# Patient Record
Sex: Female | Born: 1970 | Race: Black or African American | Hispanic: No | Marital: Married | Smoking: Former smoker
Health system: Southern US, Community
[De-identification: ages and names within clinical notes are randomized; demographics above are authoritative.]

## PROBLEM LIST (undated history)

## (undated) DIAGNOSIS — E785 Hyperlipidemia, unspecified: Secondary | ICD-10-CM

## (undated) DIAGNOSIS — Z8619 Personal history of other infectious and parasitic diseases: Secondary | ICD-10-CM

## (undated) DIAGNOSIS — F4310 Post-traumatic stress disorder, unspecified: Secondary | ICD-10-CM

## (undated) DIAGNOSIS — K219 Gastro-esophageal reflux disease without esophagitis: Secondary | ICD-10-CM

## (undated) DIAGNOSIS — Z8042 Family history of malignant neoplasm of prostate: Secondary | ICD-10-CM

## (undated) DIAGNOSIS — B019 Varicella without complication: Secondary | ICD-10-CM

## (undated) DIAGNOSIS — E669 Obesity, unspecified: Secondary | ICD-10-CM

## (undated) HISTORY — DX: Gastro-esophageal reflux disease without esophagitis: K21.9

## (undated) HISTORY — DX: Personal history of other infectious and parasitic diseases: Z86.19

## (undated) HISTORY — PX: KNEE SURGERY: SHX244

## (undated) HISTORY — PX: PARTIAL HYSTERECTOMY: SHX80

## (undated) HISTORY — DX: Family history of malignant neoplasm of prostate: Z80.42

## (undated) HISTORY — PX: ABDOMINAL HYSTERECTOMY: SHX81

## (undated) HISTORY — DX: Post-traumatic stress disorder, unspecified: F43.10

## (undated) HISTORY — DX: Hyperlipidemia, unspecified: E78.5

## (undated) HISTORY — PX: CERVIX LESION DESTRUCTION: SHX591

## (undated) HISTORY — DX: Obesity, unspecified: E66.9

## (undated) HISTORY — DX: Varicella without complication: B01.9

## (undated) HISTORY — PX: GYNECOLOGIC CRYOSURGERY: SHX857

---

## 2013-04-21 ENCOUNTER — Emergency Department: Payer: Self-pay | Admitting: Emergency Medicine

## 2013-04-21 LAB — COMPREHENSIVE METABOLIC PANEL
Albumin: 3.8 g/dL (ref 3.4–5.0)
Alkaline Phosphatase: 65 U/L (ref 50–136)
Bilirubin,Total: 0.3 mg/dL (ref 0.2–1.0)
Chloride: 106 mmol/L (ref 98–107)
Co2: 26 mmol/L (ref 21–32)
EGFR (African American): >60
EGFR (Non-African Amer.): >60
Glucose: 81 mg/dL (ref 65–99)
Osmolality: 276 (ref 275–301)
Potassium: 4.1 mmol/L (ref 3.5–5.1)
SGOT(AST): 24 U/L (ref 15–37)
SGPT (ALT): 22 U/L (ref 12–78)
Sodium: 139 mmol/L (ref 136–145)
Total Protein: 7.3 g/dL (ref 6.4–8.2)

## 2013-04-21 LAB — CBC
HCT: 38.4 % (ref 35.0–47.0)
MCH: 31.4 pg (ref 26.0–34.0)
MCHC: 34.4 g/dL (ref 32.0–36.0)
MCV: 91 fL (ref 80–100)
Platelet: 291 10*3/uL (ref 150–440)
RDW: 13.9 % (ref 11.5–14.5)
WBC: 8.2 10*3/uL (ref 3.6–11.0)

## 2013-04-21 LAB — URINALYSIS, COMPLETE
Bacteria: NONE SEEN
Ketone: NEGATIVE
Leukocyte Esterase: NEGATIVE
Ph: 6 (ref 4.5–8.0)
Protein: NEGATIVE
RBC,UR: NONE SEEN /HPF (ref 0–5)

## 2015-07-31 ENCOUNTER — Encounter: Payer: Self-pay | Admitting: Gastroenterology

## 2015-08-05 ENCOUNTER — Ambulatory Visit: Payer: Self-pay | Admitting: Gastroenterology

## 2015-09-25 ENCOUNTER — Encounter: Payer: Self-pay | Admitting: Gastroenterology

## 2015-09-25 ENCOUNTER — Ambulatory Visit (INDEPENDENT_AMBULATORY_CARE_PROVIDER_SITE_OTHER): Payer: Managed Care, Other (non HMO) | Admitting: Gastroenterology

## 2015-09-25 VITALS — BP 124/70 | HR 80 | Ht 68.0 in | Wt 234.0 lb

## 2015-09-25 DIAGNOSIS — R1011 Right upper quadrant pain: Secondary | ICD-10-CM

## 2015-09-25 NOTE — Patient Instructions (Addendum)
We will get records about your recent US report and labs from your PCP.  Further recommendations pending review of those records. Your pains are likely musculoskeletal. Stay on fiber daily.

## 2015-09-25 NOTE — Progress Notes (Signed)
HPI: This is a   very pleasant 44 year old woman  who was referred to me by Dr. Laverle Hobby to evaluate  right upper quadrant abdominal pain, abnormal liver on imaging .    Chief complaint is intermittent right upper quadrant pain. Large liver.  Outside CT scan report 07/2015: "slight enlargement of liver, stable" when compared to CT scan 02/2007   US done as well (no results) Transvaginal US done (no result).  Has been having very brief, seconds long sharp pain the at the right costophrenic margin  The pain is intermittent, no associated with eating. Certain positions can cause it.  The pain is brief (a few seconds).  More noticiable in AM.  Very quick, brief pain.  The pain is a grabbing sensation.  No associated nausea.    Overall stable weight.  Has been excersing more.  Has a problem with constipation  A lot of stress.  Having a lot of pyrosis.  Burning sensation.  This was 4-5 months ago.  Takes h2 blocker as needed.  Review of systems: Pertinent positive and negative review of systems were noted in the above HPI section. Complete review of systems was performed and was otherwise normal.   Past Medical History  Diagnosis Date  . Obesity   BMI 36 (09/2015)  Past Surgical History  Procedure Laterality Date  . Knee surgery Right   . Partial hysterectomy      Current Outpatient Prescriptions  Medication Sig Dispense Refill  . ranitidine (ZANTAC) 300 MG tablet Take 300 mg by mouth at bedtime.     No current facility-administered medications for this visit.    Allergies as of 09/25/2015  . (No Known Allergies)    Family History  Problem Relation Age of Onset  . Prostate cancer Paternal Grandfather   . Diabetes Maternal Grandmother   . Hypertension Maternal Grandmother     Social History   Social History  . Marital Status: Married    Spouse Name: N/A  . Number of Children: 4  . Years of Education: N/A   Occupational History  . nurse    Social History Main  Topics  . Smoking status: Former Smoker -- 0.25 packs/day for 2 years    Types: Cigarettes    Quit date: 08/13/2015  . Smokeless tobacco: Not on file  . Alcohol Use: No  . Drug Use: No  . Sexual Activity: Not on file   Other Topics Concern  . Not on file   Social History Narrative  . No narrative on file     Physical Exam: Ht  (1.727 m)  Wt 234 lb (106.142 kg)  BMI 35.59 kg/m2 Constitutional: generally well-appearing Psychiatric: alert and oriented x3 Eyes: extraocular movements intact Mouth: oral pharynx moist, no lesions Neck: supple no lymphadenopathy Cardiovascular: heart regular rate and rhythm Lungs: clear to auscultation bilaterally Abdomen: soft, nontender, nondistended, no obvious ascites, no peritoneal signs, normal bowel sounds Extremities: no lower extremity edema bilaterally Skin: no lesions on visible extremities   Assessment and plan: 44 y.o. female with  intermittent, brief, right sided upper abdomen versus low chest pain at the right costophrenic margin  Pain is positional. It does not seem GI related. It is not related to eating or moving her bowels. CT scan outside is normal except for slightly large liver. Same mild hepatomegaly was noted 7 or 8 years ago by CAT scan as well. She tells me ultrasound was done of her gallbladder was normal however don't have those records here.  We will get those sent here for review as well as her previous lab tests. I recommended she simply observe her symptoms and if they significantly worsen she should call but these really don't seem GI to me.   Rob Buntinganiel Kyrstal Monterrosa, MD [facility] 09/25/2015, 2:15 PM  Cc: No ref. provider found

## 2015-10-15 ENCOUNTER — Telehealth: Payer: Self-pay | Admitting: Gastroenterology

## 2015-10-15 NOTE — Telephone Encounter (Signed)
Reviewed packet of info from [place]. The was a transvaginal US but no abdominal ultrasound.  I think she still needs abdominal ultrasound for RUQ pains to rule out gallstone disease.  Thanks

## 2015-10-15 NOTE — Telephone Encounter (Signed)
Left message on machine to call back  

## 2015-11-06 NOTE — Telephone Encounter (Signed)
Unable to reach pt by phone letter mailed.  

## 2015-11-06 NOTE — Telephone Encounter (Signed)
Left message on machine to call back  

## 2016-01-28 ENCOUNTER — Telehealth: Payer: Self-pay | Admitting: Gastroenterology

## 2016-01-28 NOTE — Telephone Encounter (Signed)
Pt has BRB on toilet tissue with bowel movement.  She has a history of hemorrhoids.  Pt has constipation on a daily basis unless she eats prunes.  No pain, no fever, some gas and bloating. The pt will try Prep H and start prunes everyday, that seems to keep her bowels soft, and she will call back on Friday if the bleeding continues.  Dr Christella HartiganJacobs the pt was recommended to have US 11/16 to rule out gallbladder disease but I was unable to reach the pt.  Do you want her to have that now or see her in the office first?

## 2016-01-28 NOTE — Telephone Encounter (Signed)
i still recommend the US. Thanks

## 2016-01-28 NOTE — Telephone Encounter (Signed)
Left message on machine to call back  

## 2016-02-03 NOTE — Telephone Encounter (Signed)
Left message on machine to call back letter mailed. 

## 2017-12-13 DIAGNOSIS — Z87891 Personal history of nicotine dependence: Secondary | ICD-10-CM | POA: Insufficient documentation

## 2017-12-13 DIAGNOSIS — Z79899 Other long term (current) drug therapy: Secondary | ICD-10-CM | POA: Diagnosis not present

## 2017-12-13 DIAGNOSIS — R42 Dizziness and giddiness: Secondary | ICD-10-CM | POA: Diagnosis present

## 2017-12-13 DIAGNOSIS — R110 Nausea: Secondary | ICD-10-CM | POA: Insufficient documentation

## 2017-12-13 DIAGNOSIS — H8110 Benign paroxysmal vertigo, unspecified ear: Secondary | ICD-10-CM | POA: Diagnosis not present

## 2017-12-14 ENCOUNTER — Other Ambulatory Visit: Payer: Self-pay

## 2017-12-14 ENCOUNTER — Encounter (HOSPITAL_COMMUNITY): Payer: Self-pay | Admitting: Emergency Medicine

## 2017-12-14 ENCOUNTER — Emergency Department (HOSPITAL_COMMUNITY): Payer: BLUE CROSS/BLUE SHIELD

## 2017-12-14 ENCOUNTER — Emergency Department (HOSPITAL_COMMUNITY)
Admission: EM | Admit: 2017-12-14 | Discharge: 2017-12-14 | Disposition: A | Discharge status: Home / Self Care | Payer: BLUE CROSS/BLUE SHIELD | Attending: Emergency Medicine | Admitting: Emergency Medicine

## 2017-12-14 DIAGNOSIS — R42 Dizziness and giddiness: Secondary | ICD-10-CM

## 2017-12-14 DIAGNOSIS — H8110 Benign paroxysmal vertigo, unspecified ear: Secondary | ICD-10-CM

## 2017-12-14 LAB — I-STAT CHEM 8, ED
BUN: 13 mg/dL (ref 6–20)
CALCIUM ION: 1.19 mmol/L (ref 1.15–1.40)
CHLORIDE: 102 mmol/L (ref 101–111)
Creatinine, Ser: 0.8 mg/dL (ref 0.44–1.00)
Glucose, Bld: 128 mg/dL — ABNORMAL HIGH (ref 65–99)
HCT: 43 % (ref 36.0–46.0)
Hemoglobin: 14.6 g/dL (ref 12.0–15.0)
Potassium: 3.8 mmol/L (ref 3.5–5.1)
SODIUM: 139 mmol/L (ref 135–145)
TCO2: 26 mmol/L (ref 22–32)

## 2017-12-14 LAB — CBC
HEMATOCRIT: 39.8 % (ref 36.0–46.0)
Hemoglobin: 13.5 g/dL (ref 12.0–15.0)
MCH: 30.7 pg (ref 26.0–34.0)
MCHC: 33.9 g/dL (ref 30.0–36.0)
MCV: 90.5 fL (ref 78.0–100.0)
PLATELETS: 382 10*3/uL (ref 150–400)
RBC: 4.4 MIL/uL (ref 3.87–5.11)
RDW: 13.1 % (ref 11.5–15.5)
WBC: 13.5 10*3/uL — ABNORMAL HIGH (ref 4.0–10.5)

## 2017-12-14 MED ORDER — LORAZEPAM 2 MG/ML IJ SOLN
1.0000 mg | Freq: Once | INTRAMUSCULAR | Status: AC
  Administered 2017-12-14: 1 mg via INTRAVENOUS
  Filled 2017-12-14: qty 1

## 2017-12-14 MED ORDER — ONDANSETRON HCL 4 MG/2ML IJ SOLN
4.0000 mg | Freq: Once | INTRAMUSCULAR | Status: AC
  Administered 2017-12-14: 4 mg via INTRAVENOUS
  Filled 2017-12-14: qty 2

## 2017-12-14 MED ORDER — IOPAMIDOL (ISOVUE-370) INJECTION 76%
100.0000 mL | Freq: Once | INTRAVENOUS | Status: AC | PRN
  Administered 2017-12-14: 100 mL via INTRAVENOUS

## 2017-12-14 MED ORDER — MECLIZINE HCL 25 MG PO TABS
50.0000 mg | ORAL_TABLET | Freq: Once | ORAL | Status: AC
  Administered 2017-12-14: 50 mg via ORAL
  Filled 2017-12-14: qty 2

## 2017-12-14 MED ORDER — IOPAMIDOL (ISOVUE-370) INJECTION 76%
INTRAVENOUS | Status: AC
  Administered 2017-12-14: 100 mL via INTRAVENOUS
  Filled 2017-12-14: qty 100

## 2017-12-14 MED ORDER — MECLIZINE HCL 25 MG PO TABS: 25.0000 mg | ORAL_TABLET | Freq: Three times a day (TID) | ORAL | 0 refills | Status: DC | PRN

## 2017-12-14 NOTE — ED Provider Notes (Addendum)
[facility]-EMERGENCY DEPT Provider Note   CSN: [id number] Arrival date & time: 12/13/17  2328     History   Chief Complaint Chief Complaint  Patient presents with  . Dizziness  . Nausea    HPI Stephanie Dunlap is a 47 y.o. female.  HPI  47 year old female comes in with chief complaint of dizziness and nausea.  Patient reports that she started having dizziness around 9 PM.  Patient decided to go to sleep, but she is a on-call nurse, and when she woke up at 1030 she had dizziness again.  The dizziness since then has been constant.  Dizziness is described as unsteadiness/lightheadedness, and there is no associated spinning sensation.  Patient is also having constant nausea without emesis.  Review of system is negative for any focal numbness, tingling, weakness, vision changes, headache, neck pain.  Patient has no history of similar symptoms in the past.  Patient has no known medical problems, she does not smoke or use drugs and there is no family history of premature heart attacks or strokes.  No family history of brain aneurysms and patient denies any neck pain.   Past Medical History:  Diagnosis Date  . Obesity     There are no active problems to display for this patient.   Past Surgical History:  Procedure Laterality Date  . KNEE SURGERY Right   . PARTIAL HYSTERECTOMY      OB History    No data available       Home Medications    Prior to Admission medications   Medication Sig Start Date End Date Taking? Authorizing Provider  meclizine (ANTIVERT) 25 MG tablet Take 1 tablet (25 mg total) by mouth 3 (three) times daily as needed for dizziness. 12/14/17   Derwood Kaplan, MD    Family History Family History  Problem Relation Age of Onset  . Prostate cancer Paternal Grandfather   . Diabetes Maternal Grandmother   . Hypertension Maternal Grandmother     Social History Social History   Tobacco Use  . Smoking status: Former Smoker   Packs/day: 0.25    Years: 2.00    Pack years: 0.50    Types: Cigarettes    Last attempt to quit: 08/13/2015    Years since quitting: 2.3  . Smokeless tobacco: Never Used  Substance Use Topics  . Alcohol use: Yes    Alcohol/week: 0.0 oz    Comment: occ glass of wine  . Drug use: No     Allergies   Patient has no known allergies.   Review of Systems Review of Systems  Constitutional: Positive for activity change.  Cardiovascular: Negative for chest pain.  Gastrointestinal: Negative for abdominal pain.  Neurological: Positive for dizziness. Negative for headaches.  All other systems reviewed and are negative.    Physical Exam Updated Vital Signs BP 125/81 (BP Location: Right Arm)   Pulse 84   Temp 98.3 F (36.8 C) (Oral)   Resp 18   Ht 5\' 8"  (1.727 m)   Wt 110.7 kg (244 lb)   SpO2 97%   BMI 37.10 kg/m   Physical Exam  Constitutional: She is oriented to person, place, and time. She appears well-developed.  HENT:  Head: Normocephalic and atraumatic.  Eyes: EOM are normal. Pupils are equal, round, and reactive to light.  No nystagmus  Neck: Normal range of motion. Neck supple.  Negative hints exam.  Patient does not have meningismus  Cardiovascular: Normal rate.  Pulmonary/Chest: Effort normal.  Abdominal:  Bowel sounds are normal.  Neurological: She is alert and oriented to person, place, and time. No cranial nerve deficit. Coordination normal.  Cerebellar exam is normal (finger to nose) Sensory exam normal for bilateral upper and lower extremities - and patient is able to discriminate between sharp and dull. Motor exam is 4+/5  Patient ambulated to the bathroom for me, she felt a little unsteady throughout her ambulation.  Skin: Skin is warm and dry.  Nursing note and vitals reviewed.    ED Treatments / Results  Labs (all labs ordered are listed, but only abnormal results are displayed) Labs Reviewed  CBC - Abnormal; Notable for the following components:        Result Value   WBC 13.5 (*)    All other components within normal limits  I-STAT CHEM 8, ED - Abnormal; Notable for the following components:   Glucose, Bld 128 (*)    All other components within normal limits    EKG  EKG Interpretation None       Radiology Ct Angio Head W/cm &/or Wo Cm  Result Date: 12/14/2017 CLINICAL DATA:  Dizziness EXAM: CT ANGIOGRAPHY HEAD AND NECK TECHNIQUE: Multidetector CT imaging of the head and neck was performed using the standard protocol during bolus administration of intravenous contrast. Multiplanar CT image reconstructions and MIPs were obtained to evaluate the vascular anatomy. Carotid stenosis measurements (when applicable) are obtained utilizing NASCET criteria, using the distal internal carotid diameter as the denominator. CONTRAST:  100 mL Isovue 370 COMPARISON:  None. FINDINGS: CT HEAD FINDINGS Brain: No mass lesion, intraparenchymal hemorrhage or extra-axial collection. No evidence of acute cortical infarct. Brain parenchyma and CSF-containing spaces are normal for age. Vascular: No hyperdense vessel or unexpected calcification. Skull: Normal visualized skull base, calvarium and extracranial soft tissues. Sinuses/Orbits: No sinus fluid levels or advanced mucosal thickening. No mastoid effusion. Normal orbits. CTA NECK FINDINGS Aortic arch: There is no calcific atherosclerosis of the aortic arch. There is no aneurysm, dissection or hemodynamically significant stenosis of the visualized ascending aorta and aortic arch. Conventional 3 vessel aortic branching pattern. The visualized proximal subclavian arteries are normal. Right carotid system: The right common carotid origin is widely patent. There is no common carotid or internal carotid artery dissection or aneurysm. No hemodynamically significant stenosis. Left carotid system: The left common carotid origin is widely patent. There is no common carotid or internal carotid artery dissection or aneurysm.  No hemodynamically significant stenosis. Vertebral arteries: The vertebral system is codominant. Both vertebral artery origins are normal. Both vertebral arteries are normal to their confluence with the basilar artery. Skeleton: There is no bony spinal canal stenosis. No lytic or blastic lesions. Other neck: The nasopharynx is clear. The oropharynx and hypopharynx are normal. The epiglottis is normal. The supraglottic larynx, glottis and subglottic larynx are normal. No retropharyngeal collection. The parapharyngeal spaces are preserved. The parotid and submandibular glands are normal. No sialolithiasis or salivary ductal dilatation. The thyroid gland is normal. There is no cervical lymphadenopathy. Upper chest: No pneumothorax or pleural effusion. No nodules or masses. Review of the MIP images confirms the above findings CTA HEAD FINDINGS Anterior circulation: --Intracranial internal carotid arteries: Normal. --Anterior cerebral arteries: Normal. --Middle cerebral arteries: Normal. --Posterior communicating arteries: Absent bilaterally. Posterior circulation: --Posterior cerebral arteries: Normal. --Superior cerebellar arteries: Normal. --Basilar artery: Normal. --Anterior inferior cerebellar arteries: Normal. --Posterior inferior cerebellar arteries: Normal. Venous sinuses: As permitted by contrast timing, patent. Anatomic variants: None Delayed phase: No parenchymal contrast enhancement. Review of the  MIP images confirms the above findings. IMPRESSION: 1. No acute intracranial abnormality. 2. No emergent large vessel occlusion or hemodynamically significant stenosis. 3. No carotid or vertebral artery stenosis. Electronically Signed   By: Deatra Robinson M.D.   On: 12/14/2017 03:05   Ct Angio Neck W And/or Wo Contrast  Result Date: 12/14/2017 CLINICAL DATA:  Dizziness EXAM: CT ANGIOGRAPHY HEAD AND NECK TECHNIQUE: Multidetector CT imaging of the head and neck was performed using the standard protocol during bolus  administration of intravenous contrast. Multiplanar CT image reconstructions and MIPs were obtained to evaluate the vascular anatomy. Carotid stenosis measurements (when applicable) are obtained utilizing NASCET criteria, using the distal internal carotid diameter as the denominator. CONTRAST:  100 mL Isovue 370 COMPARISON:  None. FINDINGS: CT HEAD FINDINGS Brain: No mass lesion, intraparenchymal hemorrhage or extra-axial collection. No evidence of acute cortical infarct. Brain parenchyma and CSF-containing spaces are normal for age. Vascular: No hyperdense vessel or unexpected calcification. Skull: Normal visualized skull base, calvarium and extracranial soft tissues. Sinuses/Orbits: No sinus fluid levels or advanced mucosal thickening. No mastoid effusion. Normal orbits. CTA NECK FINDINGS Aortic arch: There is no calcific atherosclerosis of the aortic arch. There is no aneurysm, dissection or hemodynamically significant stenosis of the visualized ascending aorta and aortic arch. Conventional 3 vessel aortic branching pattern. The visualized proximal subclavian arteries are normal. Right carotid system: The right common carotid origin is widely patent. There is no common carotid or internal carotid artery dissection or aneurysm. No hemodynamically significant stenosis. Left carotid system: The left common carotid origin is widely patent. There is no common carotid or internal carotid artery dissection or aneurysm. No hemodynamically significant stenosis. Vertebral arteries: The vertebral system is codominant. Both vertebral artery origins are normal. Both vertebral arteries are normal to their confluence with the basilar artery. Skeleton: There is no bony spinal canal stenosis. No lytic or blastic lesions. Other neck: The nasopharynx is clear. The oropharynx and hypopharynx are normal. The epiglottis is normal. The supraglottic larynx, glottis and subglottic larynx are normal. No retropharyngeal collection. The  parapharyngeal spaces are preserved. The parotid and submandibular glands are normal. No sialolithiasis or salivary ductal dilatation. The thyroid gland is normal. There is no cervical lymphadenopathy. Upper chest: No pneumothorax or pleural effusion. No nodules or masses. Review of the MIP images confirms the above findings CTA HEAD FINDINGS Anterior circulation: --Intracranial internal carotid arteries: Normal. --Anterior cerebral arteries: Normal. --Middle cerebral arteries: Normal. --Posterior communicating arteries: Absent bilaterally. Posterior circulation: --Posterior cerebral arteries: Normal. --Superior cerebellar arteries: Normal. --Basilar artery: Normal. --Anterior inferior cerebellar arteries: Normal. --Posterior inferior cerebellar arteries: Normal. Venous sinuses: As permitted by contrast timing, patent. Anatomic variants: None Delayed phase: No parenchymal contrast enhancement. Review of the MIP images confirms the above findings. IMPRESSION: 1. No acute intracranial abnormality. 2. No emergent large vessel occlusion or hemodynamically significant stenosis. 3. No carotid or vertebral artery stenosis. Electronically Signed   By: Deatra Robinson M.D.   On: 12/14/2017 03:05    Procedures Procedures (including critical care time)  Medications Ordered in ED Medications  LORazepam (ATIVAN) injection 1 mg (1 mg Intravenous Given 12/14/17 0145)  ondansetron (ZOFRAN) injection 4 mg (4 mg Intravenous Given 12/14/17 0145)  iopamidol (ISOVUE-370) 76 % injection 100 mL (100 mLs Intravenous Contrast Given 12/14/17 0223)  meclizine (ANTIVERT) tablet 50 mg (50 mg Oral Given 12/14/17 0317)     Initial Impression / Assessment and Plan / ED Course  I have reviewed the triage vital signs and the nursing  notes.  Pertinent labs & imaging results that were available during my care of the patient were reviewed by me and considered in my medical decision making (see chart for details).  Clinical Course as of  Dec 14 1208  Tue Dec 14, 2017  0526 Labs and imaging results are normal.  Patient feels a lot better after she got IV Ativan and oral meclizine.  Patient now does appreciate worsening of her symptoms only when she moves her head.  We will treat patient has BPPV.  Since patient has had some pressure in her right ear, we will give her ENT follow-up as needed. CT Angio Head W/Cm &/Or Wo Cm [AN]    Clinical Course User Index [AN] Derwood KaplanNanavati, Lott Seelbach, MD    Patient comes in with chief complaint of constant dizziness.  Patient reports that her symptoms initially were intermittent, but now are constant.  Last normal around 9 PM.  Patient has no history of any kind of medical conditions, and has no stroke risk factors.  However her symptoms are not classic of peripheral vertigo, and that there are not necessarily worse with head movement, not severe, not intermittent.  Given the constant vertigo which is not severe and there is associated nausea with it we will get a CT angiogram head and neck.  If the CT angiogram is normal we will treat patient as peripheral vertigo.   Final Clinical Impressions(s) / ED Diagnoses   Final diagnoses:  Vertigo  Benign paroxysmal positional vertigo, unspecified laterality    ED Discharge Orders        Ordered    meclizine (ANTIVERT) 25 MG tablet  3 times daily PRN     12/14/17 0527       Derwood KaplanNanavati, Tavius Turgeon, MD 12/14/17 0330    Derwood KaplanNanavati, Daymeon Fischman, MD 12/14/17 1210

## 2017-12-14 NOTE — ED Triage Notes (Signed)
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

## 2017-12-14 NOTE — Discharge Instructions (Signed)
He was seen in the ER for the dizziness, which appears to be secondary to BPPV. Take the medicines prescribed and perform the exercises requested 6-10 times a day.  See the ENT doctors if your symptoms do not get better.

## 2018-10-14 ENCOUNTER — Other Ambulatory Visit: Payer: Self-pay | Admitting: Obstetrics & Gynecology

## 2018-10-14 DIAGNOSIS — Z1231 Encounter for screening mammogram for malignant neoplasm of breast: Secondary | ICD-10-CM

## 2018-11-25 ENCOUNTER — Ambulatory Visit
Admission: RE | Admit: 2018-11-25 | Discharge: 2018-11-25 | Disposition: A | Discharge status: Home / Self Care | Payer: BLUE CROSS/BLUE SHIELD | Source: Ambulatory Visit | Attending: Obstetrics & Gynecology | Admitting: Obstetrics & Gynecology

## 2018-11-25 DIAGNOSIS — Z1231 Encounter for screening mammogram for malignant neoplasm of breast: Secondary | ICD-10-CM

## 2019-05-01 ENCOUNTER — Encounter: Payer: Self-pay | Admitting: Gastroenterology

## 2019-05-01 ENCOUNTER — Other Ambulatory Visit: Payer: Self-pay

## 2019-05-01 ENCOUNTER — Ambulatory Visit (INDEPENDENT_AMBULATORY_CARE_PROVIDER_SITE_OTHER): Payer: Managed Care, Other (non HMO) | Admitting: Gastroenterology

## 2019-05-01 VITALS — Ht 68.0 in | Wt 226.0 lb

## 2019-05-01 DIAGNOSIS — K649 Unspecified hemorrhoids: Secondary | ICD-10-CM

## 2019-05-01 MED ORDER — PEG 3350-KCL-NA BICARB-NACL 420 G PO SOLR: 4000.0000 mL | ORAL | 0 refills | Status: DC

## 2019-05-01 NOTE — Patient Instructions (Addendum)
She will start taking Citrucel fiber supplement once daily instead of pill stool softener for now.  She will complete the oral steroids that she has been taking.  She will stop steroid suppositories since most of the hemorrhoidal swelling is gone now.  We will arrange a colonoscopy at her soonest convenience for colon cancer screening, she is African-American and 48 years old.  I would like the colonoscopy to be in mid July to give her some more time to recover from her hemorrhoids as well as coronavirus infection.  Thank you for entrusting me with your care and choosing [facility].  Dr Ardis Hughs

## 2019-05-01 NOTE — Progress Notes (Signed)
This service was provided via virtual visit.  We attempted audio and visual connection however visual was not possible and so we went with audio only.  The patient was located at home.  I was located in my office.  The patient did consent to this virtual visit and is aware of possible charges through their insurance for this visit.  The patient is a new patient (it has been 3 and half years since she has been here, she is here for a new problem).  my certified medical assistant, Barbaraann RondoKelly Tadros, contributed to this visit by contacting the patient by phone 1 or 2 business days prior to the appointment and also followed up on the recommendations I made after the visit.  Time spent on virtual visit: 23 minutes   HPI: This is a very pleasant 48 year old woman whom I last saw  November 2016 for intermittent brief right-sided upper abdominal pain.  It did not seem related to her GI tract.  Outside CT scan of her abdomen was normal except for slightly large liver.  The same mild hepatomegaly have been noted 7 to 8 years ago by CT scan.  She had an ultrasound elsewhere and told me that it was normal.  It turns out that was a transvaginal ultrasound.  I ordered a right upper quadrant ultrasound however she never had done.  She had Covid infection. She had some SOB, coughing, fevers.  She was diagnosed mid May, fully recovered about a week ago.  She had diarrhea 2-3 times.  She was coughing a lot. a lot of pain, swollen hemorrhoids, very raw and swollen.  She started steroid topical cream and suppository.  The hemorrhoids bled once. This helped the hemorrhoids really shrink.  Was put on oral steroids.  Should be done in 6-7 days.  Most of the hemorrhoidal swelling is gone now.  She said it is much much better than it was a week or 10 days ago.   Chief complaint is swollen, probably previously thrombosed external hemorrhoids  ROS: complete GI ROS as described in HPI, all other review  negative.  Constitutional:  No unintentional weight loss   Past Medical History:  Diagnosis Date  . Obesity     Past Surgical History:  Procedure Laterality Date  . KNEE SURGERY Right   . PARTIAL HYSTERECTOMY      Current Outpatient Medications  Medication Sig Dispense Refill  . hydrocortisone (ANUSOL-HC) 25 MG suppository Place 25 mg rectally daily.    . hydrocortisone 2.5 % cream Apply topically as needed.    . predniSONE (STERAPRED UNI-PAK 21 TAB) 10 MG (21) TBPK tablet Take by mouth daily.     No current facility-administered medications for this visit.     Allergies as of 05/01/2019  . (No Known Allergies)    Family History  Problem Relation Age of Onset  . Prostate cancer Paternal Grandfather   . Diabetes Maternal Grandmother   . Hypertension Maternal Grandmother   . Breast cancer Neg Hx     Social History   Socioeconomic History  . Marital status: Married    Spouse name: Not on file  . Number of children: 4  . Years of education: Not on file  . Highest education level: Not on file  Occupational History  . Occupation: nurse  Social Needs  . Financial resource strain: Not on file  . Food insecurity:    Worry: Not on file    Inability: Not on file  .  Transportation needs:    Medical: Not on file    Non-medical: Not on file  Tobacco Use  . Smoking status: Former Smoker    Packs/day: 0.25    Years: 2.00    Pack years: 0.50    Types: Cigarettes    Last attempt to quit: 08/13/2015    Years since quitting: 3.7  . Smokeless tobacco: Never Used  Substance and Sexual Activity  . Alcohol use: Yes    Alcohol/week: 0.0 standard drinks    Comment: occ glass of wine  . Drug use: No  . Sexual activity: Not on file  Lifestyle  . Physical activity:    Days per week: Not on file    Minutes per session: Not on file  . Stress: Not on file  Relationships  . Social connections:    Talks on phone: Not on file    Gets together: Not on file    Attends  religious service: Not on file    Active member of club or organization: Not on file    Attends meetings of clubs or organizations: Not on file    Relationship status: Not on file  . Intimate partner violence:    Fear of current or ex partner: Not on file    Emotionally abused: Not on file    Physically abused: Not on file    Forced sexual activity: Not on file  Other Topics Concern  . Not on file  Social History Narrative  . Not on file     Physical Exam: Unable to perform because this was a "telemed visit" due to current Covid-19 pandemic  Assessment and plan: 48 y.o. female with hemorrhoids  She really describes swollen and probably thrombosed external anal hemorrhoids.  Obviously I was not able to see them given that this is a telemedicine visit.  The vast majority of the swelling and discomfort has completely abated since topical steroids and near completion of oral steroids as well.  I recommended she stop the topical steroids for now.  I also recommended that she start taking fiber supplements on a daily basis.  She will complete her oral steroid course.  She is 48 years old, African-American I recommend colon cancer screening at her soonest convenience with a colonoscopy.  I think it would probably be best to give her another 4 to 6 weeks to recover from her hemorrhoids.  This will also give a bit more time since her coronavirus infection.  She felt completely recovered from that infection as of about a week ago of note.  Please see the "Patient Instructions" section for addition details about the plan.  Owens Loffler, MD [facility] 05/01/2019, 1:41 PM

## 2019-05-31 ENCOUNTER — Encounter: Payer: Self-pay | Admitting: Gastroenterology

## 2019-06-05 ENCOUNTER — Telehealth: Payer: Self-pay | Admitting: Gastroenterology

## 2019-06-05 NOTE — Telephone Encounter (Signed)

## 2019-06-06 ENCOUNTER — Ambulatory Visit (AMBULATORY_SURGERY_CENTER): Payer: Managed Care, Other (non HMO) | Admitting: Gastroenterology

## 2019-06-06 ENCOUNTER — Other Ambulatory Visit: Payer: Self-pay

## 2019-06-06 ENCOUNTER — Encounter: Payer: Self-pay | Admitting: Gastroenterology

## 2019-06-06 VITALS — BP 113/79 | HR 76 | Temp 98.9°F | Resp 14 | Ht 68.0 in | Wt 226.0 lb

## 2019-06-06 DIAGNOSIS — Z1211 Encounter for screening for malignant neoplasm of colon: Secondary | ICD-10-CM

## 2019-06-06 DIAGNOSIS — K649 Unspecified hemorrhoids: Secondary | ICD-10-CM

## 2019-06-06 MED ORDER — SODIUM CHLORIDE 0.9 % IV SOLN: 500.0000 mL | Freq: Once | INTRAVENOUS | Status: DC

## 2019-06-06 NOTE — Op Note (Signed)
[place] Patient Name: Stephanie Dunlap Procedure Date: 06/06/2019 12:48 PM MRN: [id number] Endoscopist: Milus Banister , MD Age: 48 Referring MD:  Date of Birth: 05-12-1971 Gender: Female Account #: [id number] Procedure:                Colonoscopy Indications:              Screening for colorectal malignant neoplasm Medicines:                Monitored Anesthesia Care Procedure:                Pre-Anesthesia Assessment:                           - Prior to the procedure, a History and Physical                            was performed, and patient medications and                            allergies were reviewed. The patient's tolerance of                            previous anesthesia was also reviewed. The risks                            and benefits of the procedure and the sedation                            options and risks were discussed with the patient.                            All questions were answered, and informed consent                            was obtained. Prior Anticoagulants: The patient has                            taken no previous anticoagulant or antiplatelet                            agents. ASA Grade Assessment: II - A patient with                            mild systemic disease. After reviewing the risks                            and benefits, the patient was deemed in                            satisfactory condition to undergo the procedure.                           After obtaining informed consent, the colonoscope  was passed under direct vision. Throughout the                            procedure, the patient's blood pressure, pulse, and                            oxygen saturations were monitored continuously. The                            Colonoscope was introduced through the anus and                            advanced to the the cecum, identified by                            appendiceal orifice and  ileocecal valve. The                            colonoscopy was performed without difficulty. The                            patient tolerated the procedure well. The quality                            of the bowel preparation was good. The ileocecal                            valve, appendiceal orifice, and rectum were                            photographed. Scope In: 1:21:29 PM Scope Out: 1:33:22 PM Scope Withdrawal Time: 0 hours 8 minutes 1 second  Total Procedure Duration: 0 hours 11 minutes 53 seconds  Findings:                 External and internal hemorrhoids were found. The                            hemorrhoids were medium-sized.                           The exam was otherwise without abnormality on                            direct and retroflexion views. Complications:            No immediate complications. Estimated blood loss:                            None. Estimated Blood Loss:     Estimated blood loss: none. Impression:               - Medium sized external and small internal                            hemorrhoids.                           -  The examination was otherwise normal on direct                            and retroflexion views.                           - No specimens collected. Recommendation:           - Patient has a contact number available for                            emergencies. The signs and symptoms of potential                            delayed complications were discussed with the                            patient. Return to normal activities tomorrow.                            Written discharge instructions were provided to the                            patient.                           - Resume previous diet. Continue fiber supplement                            once daily.                           - Continue present medications.                           - Repeat colonoscopy in 10 years for screening. [name], MD 06/06/2019  1:35:32 PM This report has been signed electronically.

## 2019-06-06 NOTE — Progress Notes (Signed)
Report given to PACU, vss 

## 2019-06-06 NOTE — Progress Notes (Signed)
[place] [place]  Pt was diagnosed in May 2020 with the COVID, has had a negative test since then

## 2019-06-06 NOTE — Patient Instructions (Signed)
Thank you for choosing [place] for your healthcare needs today.  REad all handouts given to  You by your recovery room nurse.  YOU HAD AN ENDOSCOPIC PROCEDURE TODAY AT [facility]:   Refer to the procedure report that was given to you for any specific questions about what was found during the examination.  If the procedure report does not answer your questions, please call your gastroenterologist to clarify.  If you requested that your care partner not be given the details of your procedure findings, then the procedure report has been included in a sealed envelope for you to review at your convenience later.  YOU SHOULD EXPECT: Some feelings of bloating in the abdomen. Passage of more gas than usual.  Walking can help get rid of the air that was put into your GI tract during the procedure and reduce the bloating. If you had a lower endoscopy (such as a colonoscopy or flexible sigmoidoscopy) you may notice spotting of blood in your stool or on the toilet paper. If you underwent a bowel prep for your procedure, you may not have a normal bowel movement for a few days.  Please Note:  You might notice some irritation and congestion in your nose or some drainage.  This is from the oxygen used during your procedure.  There is no need for concern and it should clear up in a day or so.  SYMPTOMS TO REPORT IMMEDIATELY:   Following lower endoscopy (colonoscopy or flexible sigmoidoscopy):  Excessive amounts of blood in the stool  Significant tenderness or worsening of abdominal pains  Swelling of the abdomen that is new, acute  Fever of 100F or higher   For urgent or emergent issues, a gastroenterologist can be reached at any hour by calling [phone].   DIET:  We do recommend a small meal at first, but then you may proceed to your regular diet.  Drink plenty of fluids but you should avoid alcoholic beverages for 24 hours. Try to increase the fiber in your diet, and drink plenty of  water.  ACTIVITY:  You should plan to take it easy for the rest of today and you should NOT DRIVE or use heavy machinery until tomorrow (because of the sedation medicines used during the test).    FOLLOW UP: Our staff will call the number listed on your records 48-72 hours following your procedure to check on you and address any questions or concerns that you may have regarding the information given to you following your procedure. If we do not reach you, we will leave a message.  We will attempt to reach you two times.  During this call, we will ask if you have developed any symptoms of COVID 19. If you develop any symptoms (ie: fever, flu-like symptoms, shortness of breath, cough etc.) before then, please call [phone].  If you test positive for Covid 19 in the 2 weeks post procedure, please call and report this information to [place].    If any biopsies were taken you will be contacted by phone or by letter within the next 1-3 weeks.  Please call us at [phone] if you have not heard about the biopsies in 3 weeks.    SIGNATURES/CONFIDENTIALITY: You and/or your care partner have signed paperwork which will be entered into your electronic medical record.  These signatures attest to the fact that that the information above on your After Visit Summary has been reviewed and is understood.  Full responsibility of the confidentiality  of this discharge information lies with you and/or your care-partner.

## 2019-06-08 ENCOUNTER — Telehealth: Payer: Self-pay

## 2019-06-08 NOTE — Telephone Encounter (Signed)
  Follow up Call-  Call back number 06/06/2019  Post procedure Call Back phone  # [phone]  Permission to leave phone message Yes  Some recent data might be hidden     Patient questions:  Do you have a fever, pain , or abdominal swelling? No. Pain Score  0 *  Have you tolerated food without any problems? Yes.    Have you been able to return to your normal activities? Yes.    Do you have any questions about your discharge instructions: Diet   No. Medications  No. Follow up visit  No.  Do you have questions or concerns about your Care? No.  Actions: * If pain score is 4 or above: 1. No action needed, pain <4.Have you developed a fever since your procedure? no  2.   Have you had an respiratory symptoms (SOB or cough) since your procedure? no  3.   Have you tested positive for COVID 19 since your procedure no  4.   Have you had any family members/close contacts diagnosed with the COVID 19 since your procedure?  no   If yes to any of these questions please route to Joylene John, RN and Alphonsa Gin, Therapist, sports.

## 2019-09-04 ENCOUNTER — Encounter: Payer: Self-pay | Admitting: Family Medicine

## 2019-09-04 ENCOUNTER — Ambulatory Visit: Payer: Managed Care, Other (non HMO) | Admitting: Family Medicine

## 2019-09-04 ENCOUNTER — Ambulatory Visit (INDEPENDENT_AMBULATORY_CARE_PROVIDER_SITE_OTHER): Payer: Managed Care, Other (non HMO) | Admitting: Family Medicine

## 2019-09-04 ENCOUNTER — Other Ambulatory Visit: Payer: Self-pay

## 2019-09-04 VITALS — BP 118/94 | HR 76 | Temp 98.0°F | Resp 18 | Ht 68.0 in | Wt 245.4 lb

## 2019-09-04 DIAGNOSIS — F4310 Post-traumatic stress disorder, unspecified: Secondary | ICD-10-CM | POA: Diagnosis not present

## 2019-09-04 DIAGNOSIS — R0600 Dyspnea, unspecified: Secondary | ICD-10-CM

## 2019-09-04 DIAGNOSIS — H6983 Other specified disorders of Eustachian tube, bilateral: Secondary | ICD-10-CM

## 2019-09-04 DIAGNOSIS — H6980 Other specified disorders of Eustachian tube, unspecified ear: Secondary | ICD-10-CM | POA: Insufficient documentation

## 2019-09-04 DIAGNOSIS — K644 Residual hemorrhoidal skin tags: Secondary | ICD-10-CM | POA: Insufficient documentation

## 2019-09-04 DIAGNOSIS — J1289 Other viral pneumonia: Secondary | ICD-10-CM

## 2019-09-04 DIAGNOSIS — L659 Nonscarring hair loss, unspecified: Secondary | ICD-10-CM | POA: Diagnosis not present

## 2019-09-04 DIAGNOSIS — R002 Palpitations: Secondary | ICD-10-CM

## 2019-09-04 DIAGNOSIS — U071 COVID-19: Secondary | ICD-10-CM | POA: Insufficient documentation

## 2019-09-04 DIAGNOSIS — J1282 Pneumonia due to coronavirus disease 2019: Secondary | ICD-10-CM | POA: Insufficient documentation

## 2019-09-04 DIAGNOSIS — K219 Gastro-esophageal reflux disease without esophagitis: Secondary | ICD-10-CM | POA: Insufficient documentation

## 2019-09-04 DIAGNOSIS — Z8619 Personal history of other infectious and parasitic diseases: Secondary | ICD-10-CM | POA: Insufficient documentation

## 2019-09-04 MED ORDER — FAMOTIDINE 40 MG PO TABS: 40.0000 mg | ORAL_TABLET | Freq: Every day | ORAL | 5 refills | Status: DC

## 2019-09-04 MED ORDER — ALPRAZOLAM 0.25 MG PO TABS: 0.2500 mg | ORAL_TABLET | Freq: Two times a day (BID) | ORAL | 0 refills | Status: DC | PRN

## 2019-09-04 NOTE — Assessment & Plan Note (Signed)
With some dyspnea with exertion. Proceed with echo and try alprazolam prn for a bad episode to evaluate anxiety as a cause. Proceed with echo and report if symptoms worsen

## 2019-09-04 NOTE — Assessment & Plan Note (Signed)
She reports a long history of intermittent ringing in her ears which only last a minute or so. +/- pressure in face and ears with a sense of dysequilibrium. Has had a CT angio of head that was unremarkable last year. Started on daily Cetirizine, Fluticasone and nasal saline. Reassess at next visit. Reminded to hydrate well and do not skip meals.

## 2019-09-04 NOTE — Assessment & Plan Note (Signed)
daughter had cardiac arrest from covid in front of her after the daughter was her care provider when she had COVID she is very tearful during visit. She does not want to start a dailiy SSRI yet but is seeing a counselor which she thinks is helpful. Is given a small number of Alprazolam 0.25 mg to use prn for anxiety attacks. She will call if she changes her mind

## 2019-09-04 NOTE — Patient Instructions (Addendum)
Zinc 50 mg, Biotin, fish oil daily   Synchronicity CBD Probiotics such [employer] company, [employer] colon health  Pulse oximeter  Omron Blood pressure cuff, upper arm  Weekly vitals  Mylanta   Flonase, Zyrtec and nasal saline daily call if worsening   Managing Post-Traumatic Stress Disorder If you have been diagnosed with post-traumatic stress disorder (PTSD), you may be relieved that you now know why you have felt or behaved a certain way. Still, you may feel overwhelmed about the treatment ahead. You may also wonder how to get the support you need and how to deal with the condition day-to-day. If you are living with PTSD, there are ways to help you recover from it and manage your symptoms. How to manage lifestyle changes Managing stress Stress is your body's reaction to life changes and events, both good and bad. Stress can make PTSD worse. Take the following steps to manage stress:  Talk with your health care provider or a counselor if you would like to learn more about techniques to reduce your stress. He or she may suggest some stress reduction techniques such as: ? Muscle relaxation exercises. ? Regular exercise. ? Meditation, yoga, or other mind-body exercises. ? Breathing exercises. ? Listening to quiet music. ? Spending time outside.  Maintain a healthy lifestyle. Eat a healthy diet, exercise regularly, get plenty of sleep, and take time to relax.  Spend time with others. Talk with them about how you are feeling and what kind of support you need. Try not to isolate yourself, even though you may feel like doing that. Isolating yourself can delay your recovery.  Do activities and hobbies that you enjoy.  Pace yourself when doing stressful things. Take breaks, and reward yourself when you finish. Make sure that you do not overload your schedule.  Medicines Your health care provider may suggest certain medicines if he or she feels that they will help to improve your condition.  Medicines for depression (antidepressants) or severe loss of contact with reality (antipsychotics) may be used to treat PTSD. Avoid using alcohol and other substances that may prevent your medicines from working properly. It is also important to:  Talk with your pharmacist or health care provider about all medicines that you take, their possible side effects, and which medicines are safe to take together.  Make it your goal to take part in all treatment decisions (shared decision-making). Ask about possible side effects of medicines that your health care provider recommends, and tell him or her how you feel about having those side effects. It is best if shared decision-making with your health care provider is part of your total treatment plan. If your health care provider prescribes a medicine, you may not notice the full benefits of it for 4-8 weeks. Most people who are treated for PTSD need to take medicine for at least 6-12 months after they feel better. If you are taking medicines as part of your treatment, do not stop taking medicines before you ask your health care provider if it is safe to stop. You may need to have the medicine slowly decreased (tapered) over time to lower the risk of harmful side effects. Relationships Many people who have PTSD have difficulty trusting others. Make an effort to:  Take risks and develop trust with close friends and family members. Developing trust in others can help you feel safe and connect you with emotional support.  Be open and honest about your feelings.  Have fun and relax in safe spaces, such  as with friends and family.  Think about going to couples counseling, family education classes, or family therapy. Your loved ones may not always know how to be supportive. Therapy can be helpful for everyone. How to recognize changes in your condition Be aware of your symptoms and how often you have them. The following symptoms mean that you need to seek help  for your PTSD:  You feel suspicious and angry.  You have repeated flashbacks.  You avoid going out or being with others.  You have an increasing number of fights with close friends or family members, such as your spouse.  You have thoughts about hurting yourself or others.  You cannot get relief from feelings of depression or anxiety. Follow these instructions at home: Lifestyle  Exercise regularly. Try to do 30 or more minutes of physical activity on most days of the week.  Try to get 7-9 hours of sleep each night. To help with sleep: ? Keep your bedroom cool and dark. ? Avoid screen time before bedtime. This means avoiding use of your TV, computer, tablet, and cell phone.  Practice self-soothing skills and use them daily.  Try to have fun and seek humor in your life. Eating and drinking  Do not eat a heavy meal during the hour before you go to bed.  Do not drink alcohol or caffeinated drinks before bed.  Avoid using alcohol or drugs. General instructions  If your PTSD is affecting your marriage or family, seek help from a family therapist.  Take over-the-counter and prescription medicines only as told by your health care provider.  Make sure to let all of your health care providers know that you have PTSD. This is especially important if you are having surgery or need to be admitted to the hospital.  Keep all follow-up visits as told by your health care providers. This is important. Where to find support Talking to others  Explain that PTSD is a mental health problem. It is something that a person can develop after experiencing or seeing a life-threatening event. Tell them that PTSD makes you feel stress like you did during the event.  Talk to your loved ones about the symptoms you have. Also tell them what things or situations can cause symptoms to start (are triggers for you).  Assure your loved ones that there are treatments to help PTSD. Discuss possibly seeking  family therapy or couples therapy.  If you are worried or fearful about seeking treatment, ask for support.  Keep daily contact with at least one trusted friend or family member. Finances Not all insurance plans cover mental health care, so it is important to check with your insurance carrier. If paying for co-pays or counseling services is a problem, search for a local or county mental health care center. Public mental health care services may be offered there at a low cost or no cost when you are not able to see a private health care provider. If you are a veteran, contact a local veterans organization or veterans hospital for more information. If you are taking medicine for PTSD, you may be able to get the genericform, which may be less expensive than brand-name medicine. Some makers of prescription medicines also offer help to patients who cannot afford the medicines that they need. Therapy and support groups  Find a support group in your community. Often, groups are available for [employer] veterans, trauma victims, and family members or caregivers.  Look into volunteer opportunities. Taking part in these  can help you feel more connected to your community.  Contact a local organization to find out if you are eligible for a service dog. Where to find more information Go to this website to find more information about PTSD, treatment of PTSD, and how to get support:  [facility]: www.ptsd.[employer].tnva.gov Contact a health care provider if:  Your symptoms get worse or do not get better. Get help right away if:  You have thoughts about hurting yourself or others. If you ever feel like you may hurt yourself or others, or have thoughts about taking your own life, get help right away. You can go to your nearest emergency department or call:  Your local emergency services (911 in the [place]  A suicide crisis helpline, such as the National Suicide Prevention Lifeline at 620-503-89921-970-199-1231. This  is open 24-hours a day. Summary  If you are living with PTSD, there are ways to help you recover from it and manage your symptoms.  Find supportive environments and people who understand PTSD. Spend time in those places, and maintain contact with those people.  Work with your health care team to create a plan for managing PTSD. The plan should include counseling, stress reduction techniques, and healthy lifestyle habits. This information is not intended to replace advice given to you by your health care provider. Make sure you discuss any questions you have with your health care provider. Document Released: 03/11/2017 Document Revised: 03/03/2019 Document Reviewed: 03/11/2017 Elsevier Patient Education  2020 [employer]

## 2019-09-04 NOTE — Assessment & Plan Note (Signed)
Resolved with suppositories and oral steroids and was seen by gastroenterology, Dr Ardis Hughs

## 2019-09-04 NOTE — Assessment & Plan Note (Signed)
Stress and COVID induced. Start Zinc, Biotin and fish oil and check labs.

## 2019-09-04 NOTE — Assessment & Plan Note (Signed)
She describes burning in her chest worse after eating and worse since getting COVID. Started on Famotidine 40 mg po qhs and report if no response or worsens for referral

## 2019-09-04 NOTE — Progress Notes (Signed)
Subjective:    Patient ID: Stephanie Dunlap, female    DOB: 18-Mar-1971, 48 y.o.   MRN: [id number]  Chief Complaint  Patient presents with  . Establish Care    Prior PCP [facility]. Pt has been feeling some vertigo off and on with some sinus pressure. Worse when bending over.     HPI Patient is in today for new patient appointment to establish care and help manage ongoing concerns including post traumatic stress disorder triggered by her 46 year old daughter having cardiac arrest in front of her from clots brought on by COVID and then strokes after that. Her daughter is in rehab learning to walk again now at age 46. The patient has been having flash backs, anxiety attacks, palpitations, reflux, chest burning, difficulty concentrating and lability. She is following with a counselor and that has been helpful. The patient notes increased heartburn and burning in her chest since having COVID herself back in May and she notes it is worse after eating. She reports a long history of intermittent ringing in her ears which only last a minute or so. +/- pressure in face and ears with a sense of dysequilibrium. Has had a CT angio of head that was unremarkable last year. Denies /HA/congestion/fevers or GU c/o. Taking meds as prescribed  Past Medical History:  Diagnosis Date  . Chicken pox   . GERD (gastroesophageal reflux disease)   . History of chicken pox   . Hyperlipidemia    no meds   . Obesity   . PTSD (post-traumatic stress disorder)    daughter had cardiac arrest from covid    Past Surgical History:  Procedure Laterality Date  . ABDOMINAL HYSTERECTOMY     ovaries left in place  . CERVIX LESION DESTRUCTION    . GYNECOLOGIC CRYOSURGERY    . KNEE SURGERY Right   . PARTIAL HYSTERECTOMY      Family History  Problem Relation Age of Onset  . Prostate cancer Paternal Grandfather   . Hypertension Paternal Grandfather   . Cancer Paternal Grandfather    prostate  . Diabetes Maternal Grandmother   . Hypertension Maternal Grandmother   . Diabetes Mother   . Hyperlipidemia Mother   . Hypertension Father   . Atrial fibrillation Brother   . Hypertension Brother   . Sleep apnea Brother   . Stroke Daughter   . Heart disease Daughter        cardiac arrest during COVID with defib  . Depression Sister        PTSD  . Heart disease Son        arrythmia?  . Heart disease Maternal Grandfather        MI  . Hypertension Paternal Grandmother   . Thyroid disease Paternal Grandmother   . Breast cancer Neg Hx   . Colon cancer Neg Hx   . Esophageal cancer Neg Hx   . Stomach cancer Neg Hx   . Rectal cancer Neg Hx     Social History   Socioeconomic History  . Marital status: Married    Spouse name: Not on file  . Number of children: 4  . Years of education: Not on file  . Highest education level: Not on file  Occupational History  . Occupation: nurse  Social Needs  . Financial resource strain: Not on file  . Food insecurity    Worry: Not on file    Inability: Not on file  . Transportation needs  Medical: Not on file    Non-medical: Not on file  Tobacco Use  . Smoking status: Former Smoker    Packs/day: 0.25    Years: 2.00    Pack years: 0.50    Types: Cigarettes    Quit date: 08/13/2015    Years since quitting: 4.0  . Smokeless tobacco: Never Used  Substance and Sexual Activity  . Alcohol use: Yes    Alcohol/week: 0.0 standard drinks    Comment: occ glass of wine  . Drug use: No  . Sexual activity: Not on file  Lifestyle  . Physical activity    Days per week: Not on file    Minutes per session: Not on file  . Stress: Not on file  Relationships  . Social Herbalist on phone: Not on file    Gets together: Not on file    Attends religious service: Not on file    Active member of club or organization: Not on file    Attends meetings of clubs or organizations: Not on file    Relationship status: Not on file  .  Intimate partner violence    Fear of current or ex partner: Not on file    Emotionally abused: Not on file    Physically abused: Not on file    Forced sexual activity: Not on file  Other Topics Concern  . 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 aspirin EC 81 MG tablet Take 81 mg by mouth daily.    . hydrocortisone (ANUSOL-HC) 25 MG suppository Place 25 mg rectally daily.    . hydrocortisone 2.5 % cream Apply topically as needed.    . NON FORMULARY Take 1 tablet by mouth daily. Sea moss, burdock root    . NON FORMULARY Take 1 tablet by mouth daily. Virapro    . Vitamin D, Cholecalciferol, 25 MCG (1000 UT) CAPS Take 2 tablets by mouth daily. 2,000 units daily    . polyethylene glycol-electrolytes (NULYTELY/GOLYTELY) 420 g solution Take 4,000 mLs by mouth as directed. (Patient not taking: Reported on 09/04/2019) 4000 mL 0   No facility-administered medications prior to visit.     No Known Allergies  Review of Systems  Constitutional: Positive for malaise/fatigue. Negative for chills and fever.  HENT: Positive for sinus pain and tinnitus. Negative for congestion and hearing loss.   Eyes: Negative for discharge.  Respiratory: Positive for shortness of breath. Negative for cough and sputum production.   Cardiovascular: Positive for chest pain and palpitations. Negative for leg swelling.  Gastrointestinal: Positive for heartburn. Negative for abdominal pain, blood in stool, constipation, diarrhea, nausea and vomiting.  Genitourinary: Negative for dysuria, frequency, hematuria and urgency.  Musculoskeletal: Negative for back pain, falls and myalgias.  Skin: Negative for rash.  Neurological: Positive for dizziness. Negative for sensory change,  loss of consciousness, weakness and headaches.  Endo/Heme/Allergies: Negative for environmental allergies. Does not bruise/bleed easily.  Psychiatric/Behavioral: Positive for depression. Negative for suicidal ideas. The patient is nervous/anxious. The patient does not have insomnia.        Objective:    Physical Exam Constitutional:      General: She is not in acute distress.    Appearance: She is well-developed.  HENT:     Head: Normocephalic and atraumatic.  Eyes:     Conjunctiva/sclera: Conjunctivae normal.  Neck:     Musculoskeletal: Neck supple.     Thyroid: No thyromegaly.  Cardiovascular:     Rate and Rhythm: Normal rate and regular rhythm.     Heart sounds: Normal heart sounds. No murmur.  Pulmonary:     Effort: Pulmonary effort is normal. No respiratory distress.     Breath sounds: Normal breath sounds.  Abdominal:     General: Bowel sounds are normal. There is no distension.     Palpations: Abdomen is soft. There is no mass.     Tenderness: There is no abdominal tenderness.  Lymphadenopathy:     Cervical: No cervical adenopathy.  Skin:    General: Skin is warm and dry.  Neurological:     Mental Status: She is alert and oriented to person, place, and time.     Cranial Nerves: No cranial nerve deficit.     Deep Tendon Reflexes: Reflexes normal.  Psychiatric:        Behavior: Behavior normal.     BP (!) 118/94 (BP Location: Left Arm, Patient Position: Sitting, Cuff Size: Normal)   Pulse 76   Temp 98 F (36.7 C) (Temporal)   Resp 18   Ht 5\' 8"  (1.727 m)   Wt 245 lb 6 oz (111.3 kg)   SpO2 97%   BMI 37.31 kg/m  Wt Readings from Last 3 Encounters:  09/04/19 245 lb 6 oz (111.3 kg)  06/06/19 226 lb (102.5 kg)  05/01/19 226 lb (102.5 kg)    Diabetic Foot Exam - Simple   No data filed     Lab Results  Component Value Date   WBC 13.5 (H) 12/14/2017   HGB 14.6 12/14/2017   HCT 43.0 12/14/2017   PLT 382 12/14/2017   GLUCOSE 128 (H) 12/14/2017   ALT 22  04/21/2013   AST 24 04/21/2013   NA 139 12/14/2017   K 3.8 12/14/2017   CL 102 12/14/2017   CREATININE 0.80 12/14/2017   BUN 13 12/14/2017   CO2 26 04/21/2013    No results found for: TSH Lab Results  Component Value Date   WBC 13.5 (H) 12/14/2017   HGB 14.6 12/14/2017   HCT 43.0 12/14/2017   MCV 90.5 12/14/2017   PLT 382 12/14/2017   Lab Results  Component Value Date   NA 139 12/14/2017   K 3.8 12/14/2017   CO2 26 04/21/2013   GLUCOSE 128 (H) 12/14/2017   BUN 13 12/14/2017   CREATININE 0.80 12/14/2017   BILITOT 0.3 04/21/2013   ALKPHOS 65 04/21/2013   AST 24 04/21/2013   ALT 22 04/21/2013   PROT 7.3 04/21/2013   ALBUMIN 3.8 04/21/2013   CALCIUM 8.9 04/21/2013   ANIONGAP 7 04/21/2013   No results found for: CHOL No results found for: HDL No results found for: LDLCALC No results found for: TRIG No results found for: CHOLHDL No results found for: 04/23/2013     Assessment & Plan:   Problem List Items Addressed This Visit    Pneumonia due to COVID-19 virus    Back in May and she if feeling much better. She had cough, headache, SOB, palpitations, burning in chest and malaise, fatigue. Symptoms resolved or improved.       Loss of hair    Stress and COVID induced. Start Zinc, Biotin and fish oil and check labs.      PTSD (post-traumatic stress  disorder)    daughter had cardiac arrest from covid in front of her after the daughter was her care provider when she had COVID she is very tearful during visit. She does not want to start a dailiy SSRI yet but is seeing a counselor which she thinks is helpful. Is given a small number of Alprazolam 0.25 mg to use prn for anxiety attacks. She will call if she changes her mind      Relevant Medications   ALPRAZolam (XANAX) 0.25 MG tablet   Other Relevant Orders   Zinc   History of chicken pox   External hemorrhoids    Resolved with suppositories and oral steroids and was seen by gastroenterology, Dr Christella Hartigan       Palpitations    With some dyspnea with exertion. Proceed with echo and try alprazolam prn for a bad episode to evaluate anxiety as a cause. Proceed with echo and report if symptoms worsen      Acid reflux    She describes burning in her chest worse after eating and worse since getting COVID. Started on Famotidine 40 mg po qhs and report if no response or worsens for referral      Relevant Medications   famotidine (PEPCID) 40 MG tablet   Eustachian tube dysfunction    She reports a long history of intermittent ringing in her ears which only last a minute or so. +/- pressure in face and ears with a sense of dysequilibrium. Has had a CT angio of head that was unremarkable last year. Started on daily Cetirizine, Fluticasone and nasal saline. Reassess at next visit. Reminded to hydrate well and do not skip meals.        Other Visit Diagnoses    Dyspnea, unspecified type    -  Primary   Relevant Orders   ECHOCARDIOGRAM COMPLETE      I have discontinued Dorella Duffin's polyethylene glycol-electrolytes. I am also having her start on famotidine and ALPRAZolam. Additionally, I am having her maintain her hydrocortisone, hydrocortisone, aspirin EC, NON FORMULARY, NON FORMULARY, and Vitamin D (Cholecalciferol).  Meds ordered this encounter  Medications  . famotidine (PEPCID) 40 MG tablet    Sig: Take 1 tablet (40 mg total) by mouth daily.    Dispense:  30 tablet    Refill:  5  . ALPRAZolam (XANAX) 0.25 MG tablet    Sig: Take 1 tablet (0.25 mg total) by mouth 2 (two) times daily as needed for anxiety.    Dispense:  20 tablet    Refill:  0     Danise Edge, MD

## 2019-09-04 NOTE — Assessment & Plan Note (Addendum)
Back in May and she if feeling much better. She had cough, headache, SOB, palpitations, burning in chest and malaise, fatigue. Symptoms resolved or improved.

## 2019-09-06 LAB — ZINC: Zinc: 87 ug/dL (ref 60–130)

## 2019-09-07 ENCOUNTER — Ambulatory Visit (HOSPITAL_BASED_OUTPATIENT_CLINIC_OR_DEPARTMENT_OTHER)
Admission: RE | Admit: 2019-09-07 | Discharge: 2019-09-07 | Disposition: A | Discharge status: Home / Self Care | Payer: Managed Care, Other (non HMO) | Source: Ambulatory Visit | Attending: Family Medicine | Admitting: Family Medicine

## 2019-09-07 DIAGNOSIS — R0600 Dyspnea, unspecified: Secondary | ICD-10-CM | POA: Insufficient documentation

## 2019-09-07 DIAGNOSIS — I340 Nonrheumatic mitral (valve) insufficiency: Secondary | ICD-10-CM | POA: Diagnosis not present

## 2019-09-07 NOTE — Progress Notes (Signed)
  Echocardiogram 2D Echocardiogram has been performed.  Cardell Peach 09/07/2019, 2:54 PM

## 2019-09-14 ENCOUNTER — Encounter: Payer: Self-pay | Admitting: Family Medicine

## 2019-09-14 DIAGNOSIS — R300 Dysuria: Secondary | ICD-10-CM

## 2019-09-14 NOTE — Telephone Encounter (Signed)
Spoke with patient she agreed to bring urine sample in tomorrow

## 2019-09-15 ENCOUNTER — Other Ambulatory Visit (INDEPENDENT_AMBULATORY_CARE_PROVIDER_SITE_OTHER): Payer: Managed Care, Other (non HMO)

## 2019-09-15 ENCOUNTER — Other Ambulatory Visit: Payer: Self-pay | Admitting: Family Medicine

## 2019-09-15 DIAGNOSIS — R300 Dysuria: Secondary | ICD-10-CM

## 2019-09-15 LAB — URINALYSIS
Bilirubin Urine: NEGATIVE
Hgb urine dipstick: NEGATIVE
Ketones, ur: NEGATIVE
Leukocytes,Ua: NEGATIVE
Nitrite: NEGATIVE
Specific Gravity, Urine: 1.02 (ref 1.000–1.030)
Total Protein, Urine: NEGATIVE
Urine Glucose: NEGATIVE
Urobilinogen, UA: 4 — AB (ref 0.0–1.0)
pH: 7 (ref 5.0–8.0)

## 2019-09-15 MED ORDER — NITROFURANTOIN MONOHYD MACRO 100 MG PO CAPS: 100.0000 mg | ORAL_CAPSULE | Freq: Two times a day (BID) | ORAL | 0 refills | Status: DC

## 2019-09-17 LAB — URINE CULTURE
MICRO NUMBER:: 1023601
SPECIMEN QUALITY:: ADEQUATE

## 2019-11-06 ENCOUNTER — Ambulatory Visit: Payer: Managed Care, Other (non HMO) | Admitting: Family Medicine

## 2019-11-07 ENCOUNTER — Other Ambulatory Visit: Payer: Self-pay

## 2019-11-07 ENCOUNTER — Ambulatory Visit (INDEPENDENT_AMBULATORY_CARE_PROVIDER_SITE_OTHER): Payer: Managed Care, Other (non HMO) | Admitting: Family Medicine

## 2019-11-07 ENCOUNTER — Encounter: Payer: Self-pay | Admitting: Family Medicine

## 2019-11-07 DIAGNOSIS — U071 COVID-19: Secondary | ICD-10-CM

## 2019-11-07 DIAGNOSIS — F4310 Post-traumatic stress disorder, unspecified: Secondary | ICD-10-CM

## 2019-11-07 DIAGNOSIS — I059 Rheumatic mitral valve disease, unspecified: Secondary | ICD-10-CM | POA: Diagnosis not present

## 2019-11-07 DIAGNOSIS — K219 Gastro-esophageal reflux disease without esophagitis: Secondary | ICD-10-CM

## 2019-11-07 DIAGNOSIS — J1289 Other viral pneumonia: Secondary | ICD-10-CM

## 2019-11-07 DIAGNOSIS — J1282 Pneumonia due to coronavirus disease 2019: Secondary | ICD-10-CM

## 2019-11-08 DIAGNOSIS — I059 Rheumatic mitral valve disease, unspecified: Secondary | ICD-10-CM | POA: Insufficient documentation

## 2019-11-08 NOTE — Assessment & Plan Note (Signed)
Avoid offending foods, start probiotics. Do not eat large meals in late evening and consider raising head of bed.  

## 2019-11-08 NOTE — Assessment & Plan Note (Signed)
She is feeling much better. No new concerns.

## 2019-11-08 NOTE — Assessment & Plan Note (Addendum)
Palpitations doing better and recent echo shows mild mitral valve regurgitation. She is encouraged to eat a heart healthy diet, exercise and sleep regularly and attempt modest weigh loss.

## 2019-11-08 NOTE — Assessment & Plan Note (Signed)
She is managing her stress some better and Alprazolam has been hlepful on occasion. No changes today

## 2019-11-08 NOTE — Progress Notes (Signed)
Virtual Visit via Video Note  I connected with Stephanie Dunlap on 11/07/19 at  3:00 PM EST by a video enabled telemedicine application and verified that I am speaking with the correct person using two identifiers.  Location: Patient: home Provider: office   I discussed the limitations of evaluation and management by telemedicine and the availability of in person appointments. The patient expressed understanding and agreed to proceed. CMA was able to get the patient set up on a video visit.    Subjective:    Patient ID: Stephanie Dunlap, female    DOB: 05-26-1971, 48 y.o.   MRN: [id number]  No chief complaint on file.   HPI Patient is in today for follow up on chronic medical concerns including hyperlipidemia, palpitations, PTSD and more. She is feeling much better and offers no new acute concerns. No recent hospitalizations or febrile illness since last visit. Denies CP/SOB/HA/congestion/fevers or GU c/o. Taking meds as prescribed  Past Medical History:  Diagnosis Date  . Chicken pox   . GERD (gastroesophageal reflux disease)   . History of chicken pox   . Hyperlipidemia    no meds   . Obesity   . PTSD (post-traumatic stress disorder)    daughter had cardiac arrest from covid    Past Surgical History:  Procedure Laterality Date  . ABDOMINAL HYSTERECTOMY     ovaries left in place  . CERVIX LESION DESTRUCTION    . GYNECOLOGIC CRYOSURGERY    . KNEE SURGERY Right   . PARTIAL HYSTERECTOMY      Family History  Problem Relation Age of Onset  . Prostate cancer Paternal Grandfather   . Hypertension Paternal Grandfather   . Cancer Paternal Grandfather        prostate  . Diabetes Maternal Grandmother   . Hypertension Maternal Grandmother   . Diabetes Mother   . Hyperlipidemia Mother   . Hypertension Father   . Atrial fibrillation Brother   . Hypertension Brother   . Sleep apnea Brother   . Stroke Daughter   . Heart disease Daughter        cardiac arrest during COVID with  defib  . Depression Sister        PTSD  . Heart disease Son        arrythmia?  . Heart disease Maternal Grandfather        MI  . Hypertension Paternal Grandmother   . Thyroid disease Paternal Grandmother   . Breast cancer Neg Hx   . Colon cancer Neg Hx   . Esophageal cancer Neg Hx   . Stomach cancer Neg Hx   . Rectal cancer Neg Hx     Social History   Socioeconomic History  . Marital status: Married    Spouse name: Not on file  . Number of children: 4  . Years of education: Not on file  . Highest education level: Not on file  Occupational History  . Occupation: nurse  Tobacco Use  . Smoking status: Former Smoker    Packs/day: 0.25    Years: 2.00    Pack years: 0.50    Types: Cigarettes    Quit date: 08/13/2015    Years since quitting: 4.2  . Smokeless tobacco: Never Used  Substance and Sexual Activity  . Alcohol use: Yes    Alcohol/week: 0.0 standard drinks    Comment: occ glass of wine  . Drug use: No  . Sexual activity: Not on file  Other Topics Concern  . 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 Difficulty of Paying Living Expenses: Not on file  Food Insecurity:   . Worried About Charity fundraiser in the Last Year: Not on file  . Ran Out of Food in the Last Year: Not on file  Transportation Needs:   . Lack of Transportation (Medical): Not on file  . Lack of Transportation (Non-Medical): Not on file  Physical Activity:   . Days of Exercise per Week: Not on file  . Minutes of Exercise per Session: Not on file  Stress:   . Feeling of Stress : Not on file  Social Connections:   . Frequency of Communication with Friends and Family: Not on file  . Frequency of Social Gatherings with Friends and Family: Not on file  .  Attends Religious Services: Not on file  . Active Member of Clubs or Organizations: Not on file  . Attends Archivist Meetings: Not on file  . Marital Status: Not on file  Intimate Partner Violence:   . Fear of Current or Ex-Partner: Not on file  . Emotionally Abused: Not on file  . Physically Abused: Not on file  . Sexually Abused: Not on file    Outpatient Medications Prior to Visit  Medication Sig Dispense Refill  . ALPRAZolam (XANAX) 0.25 MG tablet Take 1 tablet (0.25 mg total) by mouth 2 (two) times daily as needed for anxiety. 20 tablet 0  . aspirin EC 81 MG tablet Take 81 mg by mouth daily.    . famotidine (PEPCID) 40 MG tablet Take 1 tablet (40 mg total) by mouth daily. 30 tablet 5  . hydrocortisone (ANUSOL-HC) 25 MG suppository Place 25 mg rectally daily.    . hydrocortisone 2.5 % cream Apply topically as needed.    . nitrofurantoin, macrocrystal-monohydrate, (MACROBID) 100 MG capsule Take 1 capsule (100 mg total) by mouth 2 (two) times daily. 6 capsule 0  . NON FORMULARY Take 1 tablet by mouth daily. Sea moss, burdock root    . NON FORMULARY Take 1 tablet by mouth daily. Virapro    . Vitamin D, Cholecalciferol, 25 MCG (1000 UT) CAPS Take 2 tablets by mouth daily. 2,000 units daily     No facility-administered medications prior to visit.    No Known Allergies  Review of Systems  Constitutional: Negative for fever and malaise/fatigue.  HENT: Negative for congestion.   Eyes: Negative for blurred vision.  Respiratory: Negative for shortness of breath.   Cardiovascular: Positive for palpitations. Negative for chest pain and leg swelling.  Gastrointestinal: Negative for abdominal pain, blood in stool and nausea.  Genitourinary: Negative for dysuria and frequency.  Musculoskeletal: Negative for falls.  Skin: Negative for rash.  Neurological: Negative for dizziness, loss of consciousness and headaches.  Endo/Heme/Allergies: Negative for environmental allergies.    Psychiatric/Behavioral: Negative for depression. The patient is not nervous/anxious.        Objective:    Physical Exam Constitutional:      Appearance: Normal appearance. She is not ill-appearing.  HENT:     Head: Normocephalic and atraumatic.     Nose: Nose normal.  Eyes:     General:        Right eye: No discharge.  Left eye: No discharge.  Pulmonary:     Effort: Pulmonary effort is normal.  Neurological:     Mental Status: She is alert and oriented to person, place, and time.  Psychiatric:        Mood and Affect: Mood normal.        Behavior: Behavior normal.     Wt 245 lb (111.1 kg)   BMI 37.25 kg/m  Wt Readings from Last 3 Encounters:  11/07/19 245 lb (111.1 kg)  09/04/19 245 lb 6 oz (111.3 kg)  06/06/19 226 lb (102.5 kg)    Diabetic Foot Exam - Simple   No data filed     Lab Results  Component Value Date   WBC 13.5 (H) 12/14/2017   HGB 14.6 12/14/2017   HCT 43.0 12/14/2017   PLT 382 12/14/2017   GLUCOSE 128 (H) 12/14/2017   ALT 22 04/21/2013   AST 24 04/21/2013   NA 139 12/14/2017   K 3.8 12/14/2017   CL 102 12/14/2017   CREATININE 0.80 12/14/2017   BUN 13 12/14/2017   CO2 26 04/21/2013    No results found for: TSH Lab Results  Component Value Date   WBC 13.5 (H) 12/14/2017   HGB 14.6 12/14/2017   HCT 43.0 12/14/2017   MCV 90.5 12/14/2017   PLT 382 12/14/2017   Lab Results  Component Value Date   NA 139 12/14/2017   K 3.8 12/14/2017   CO2 26 04/21/2013   GLUCOSE 128 (H) 12/14/2017   BUN 13 12/14/2017   CREATININE 0.80 12/14/2017   BILITOT 0.3 04/21/2013   ALKPHOS 65 04/21/2013   AST 24 04/21/2013   ALT 22 04/21/2013   PROT 7.3 04/21/2013   ALBUMIN 3.8 04/21/2013   CALCIUM 8.9 04/21/2013   ANIONGAP 7 04/21/2013   No results found for: CHOL No results found for: HDL No results found for: LDLCALC No results found for: TRIG No results found for: CHOLHDL No results found for: [id number]     Assessment & Plan:   Problem  List Items Addressed This Visit    Pneumonia due to COVID-19 virus    She is feeling much better. No new concerns.       PTSD (post-traumatic stress disorder)    She is managing her stress some better and Alprazolam has been hlepful on occasion. No changes today      Acid reflux    Avoid offending foods, start probiotics. Do not eat large meals in late evening and consider raising head of bed.       Mitral valve disorder    Palpitations doing better and recent echo shows mild mitral valve regurgitation. She is encouraged to eat a heart healthy diet, exercise and sleep regularly and attempt modest weigh loss.          I am having Finnleigh Ebeling maintain her hydrocortisone, hydrocortisone, aspirin EC, NON FORMULARY, NON FORMULARY, Vitamin D (Cholecalciferol), famotidine, ALPRAZolam, and nitrofurantoin (macrocrystal-monohydrate).  No orders of the defined types were placed in this encounter.    I discussed the assessment and treatment plan with the patient. The patient was provided an opportunity to ask questions and all were answered. The patient agreed with the plan and demonstrated an understanding of the instructions.   The patient was advised to call back or seek an in-person evaluation if the symptoms worsen or if the condition fails to improve as anticipated.  I provided 25 minutes of non-face-to-face time during this encounter.   Danise Edge, MD

## 2020-12-12 ENCOUNTER — Other Ambulatory Visit: Payer: Self-pay | Admitting: Family Medicine

## 2021-01-13 ENCOUNTER — Other Ambulatory Visit: Payer: Self-pay

## 2021-01-13 ENCOUNTER — Telehealth (INDEPENDENT_AMBULATORY_CARE_PROVIDER_SITE_OTHER): Payer: Managed Care, Other (non HMO) | Admitting: Family Medicine

## 2021-01-13 DIAGNOSIS — F4310 Post-traumatic stress disorder, unspecified: Secondary | ICD-10-CM

## 2021-01-13 DIAGNOSIS — R002 Palpitations: Secondary | ICD-10-CM | POA: Diagnosis not present

## 2021-01-13 DIAGNOSIS — K219 Gastro-esophageal reflux disease without esophagitis: Secondary | ICD-10-CM | POA: Diagnosis not present

## 2021-01-13 MED ORDER — FAMOTIDINE 40 MG PO TABS: 40.0000 mg | ORAL_TABLET | Freq: Every day | ORAL | 5 refills | Status: AC

## 2021-01-13 NOTE — Assessment & Plan Note (Signed)
Encouraged DASH or MIND diet, decrease po intake and increase exercise as tolerated. Needs 7-8 hours of sleep nightly. Avoid trans fats, eat small, frequent meals every 4-5 hours with lean proteins, complex carbs and healthy fats. Minimize simple carbs, consider HH&W

## 2021-01-13 NOTE — Progress Notes (Signed)
Virtual Visit via phone Note  I connected with Stephanie Dunlap on 01/13/21 at  8:40 AM EST by a phone enabled telemedicine application and verified that I am speaking with the correct person using two identifiers.  Location: Patient: home, patient and provider in visit Provider: office   I discussed the limitations of evaluation and management by telemedicine and the availability of in person appointments. The patient expressed understanding and agreed to proceed. S Chism, CMA was able to get the patient set up on a mychart video visit but the patient was unable to get her camera turned on    Subjective:    Patient ID: Stephanie Dunlap, female    DOB: [birth year], 50 y.o.   MRN: [id number]  Chief Complaint  Patient presents with  . Follow-up    HPI Patient is in today for follow up on chronic medical concerns. She feels she has largely recovered from COVID and her greatest concern is that after loosing weight she has gained it back due to stress eating. She is noting an increase in her reflux symptoms as a result with burning chest and throat pain and sour fluid in throat. Worse after eating too much especially close to bedtime and eating fatty and spicy foods. Her stress is better now that she is back to work and she has not needed any alprazolam. Denies palp/SOB/HA/congestion/fevers or GU c/o. Taking meds as prescribed  Past Medical History:  Diagnosis Date  . Chicken pox   . GERD (gastroesophageal reflux disease)   . History of chicken pox   . Hyperlipidemia    no meds   . Obesity   . PTSD (post-traumatic stress disorder)    daughter had cardiac arrest from covid    Past Surgical History:  Procedure Laterality Date  . ABDOMINAL HYSTERECTOMY     ovaries left in place  . CERVIX LESION DESTRUCTION    . GYNECOLOGIC CRYOSURGERY    . KNEE SURGERY Right   . PARTIAL HYSTERECTOMY      Family History  Problem Relation Age of Onset  . Prostate cancer Paternal Grandfather   .  Hypertension Paternal Grandfather   . Cancer Paternal Grandfather        prostate  . Diabetes Maternal Grandmother   . Hypertension Maternal Grandmother   . Diabetes Mother   . Hyperlipidemia Mother   . Hypertension Father   . Atrial fibrillation Brother   . Hypertension Brother   . Sleep apnea Brother   . Stroke Daughter   . Heart disease Daughter        cardiac arrest during COVID with defib  . Depression Sister        PTSD  . Heart disease Son        arrythmia?  . Heart disease Maternal Grandfather        MI  . Hypertension Paternal Grandmother   . Thyroid disease Paternal Grandmother   . Breast cancer Neg Hx   . Colon cancer Neg Hx   . Esophageal cancer Neg Hx   . Stomach cancer Neg Hx   . Rectal cancer Neg Hx     Social History   Socioeconomic History  . Marital status: Married    Spouse name: Not on file  . Number of children: 4  . Years of education: Not on file  . Highest education level: Not on file  Occupational History  . Occupation: nurse  Tobacco Use  . Smoking status: Former Smoker    Packs/day: 0.25  Years: 2.00    Pack years: 0.50    Types: Cigarettes    Quit date: 08/13/2015    Years since quitting: 5.4  . Smokeless tobacco: Never Used  Vaping Use  . Vaping Use: Never used  Substance and Sexual Activity  . Alcohol use: Yes    Alcohol/week: 0.0 standard drinks    Comment: occ glass of wine  . Drug use: No  . Sexual activity: Not on file  Other Topics Concern  . 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 ALPRAZolam (XANAX) 0.25 MG tablet Take 1 tablet (0.25 mg total) by mouth 2 (two) times daily as needed for anxiety. 20 tablet 0  . aspirin EC 81 MG tablet Take 81 mg by mouth daily.    . famotidine (PEPCID) 40 MG tablet Take 1 tablet (40 mg total) by mouth daily. 30 tablet 5  . Vitamin D, Cholecalciferol, 25 MCG (1000 UT) CAPS Take 2 tablets by mouth daily. 2,000 units daily    . hydrocortisone 2.5 % cream Apply topically as needed.    . NON FORMULARY Take 1 tablet by mouth daily. Sea moss, burdock root    . NON FORMULARY Take 1 tablet by mouth daily. Virapro    . hydrocortisone (ANUSOL-HC) 25 MG suppository Place 25 mg rectally daily.    . nitrofurantoin, macrocrystal-monohydrate, (MACROBID) 100 MG capsule Take 1 capsule (100 mg total) by mouth 2 (two) times daily. 6 capsule 0   No facility-administered medications prior to visit.    No Known Allergies  Review of Systems  Constitutional: Negative for fever and malaise/fatigue.  HENT: Negative for congestion.   Eyes: Negative for blurred vision.  Respiratory: Negative for shortness of breath.   Cardiovascular: Negative for chest pain, palpitations and leg swelling.  Gastrointestinal: Positive for heartburn. Negative for abdominal pain, blood in stool and nausea.  Genitourinary: Negative for dysuria and frequency.  Musculoskeletal: Negative for falls.  Skin: Negative for rash.  Neurological: Negative for dizziness, loss of consciousness and headaches.  Endo/Heme/Allergies: Negative for environmental allergies.  Psychiatric/Behavioral: Negative for depression. The patient is not nervous/anxious.        Objective:    Physical Exam unable to obtain via phone visit  There were no vitals taken for this visit. Wt Readings from Last 3 Encounters:  11/07/19 245 lb (111.1 kg)  09/04/19 245 lb 6 oz (111.3 kg)  06/06/19 226 lb (102.5 kg)    Diabetic Foot  Exam - Simple   No data filed    Lab Results  Component Value Date   WBC 13.5 (H) 12/14/2017   HGB 14.6 12/14/2017   HCT 43.0 12/14/2017   PLT 382 12/14/2017   GLUCOSE 128 (H) 12/14/2017   ALT 22 04/21/2013   AST 24 04/21/2013   NA 139 12/14/2017   K 3.8 12/14/2017   CL 102 12/14/2017   CREATININE 0.80 12/14/2017   BUN 13 12/14/2017   CO2  26 04/21/2013    No results found for: TSH Lab Results  Component Value Date   WBC 13.5 (H) 12/14/2017   HGB 14.6 12/14/2017   HCT 43.0 12/14/2017   MCV 90.5 12/14/2017   PLT 382 12/14/2017   Lab Results  Component Value Date   NA 139 12/14/2017   K 3.8 12/14/2017   CO2 26 04/21/2013   GLUCOSE 128 (H) 12/14/2017   BUN 13 12/14/2017   CREATININE 0.80 12/14/2017   BILITOT 0.3 04/21/2013   ALKPHOS 65 04/21/2013   AST 24 04/21/2013   ALT 22 04/21/2013   PROT 7.3 04/21/2013   ALBUMIN 3.8 04/21/2013   CALCIUM 8.9 04/21/2013   ANIONGAP 7 04/21/2013      Assessment & Plan:   Problem List Items Addressed This Visit    PTSD (post-traumatic stress disorder)    She is doing better and her anxiety is well enough she has not needed doses of Alprazolam recently. She is back at work and she feels that helps her      Palpitations    No recent flares endorsed.       Relevant Orders   CBC   Comprehensive metabolic panel   TSH   Acid reflux - Primary    Has been worse recently with some weight gain and poor eating habits. Avoid offending foods, start probiotics. Do not eat large meals in late evening and consider raising head of bed. Try Mylanta prn when pain develops. Seek care if no resolution.       Relevant Medications   famotidine (PEPCID) 40 MG tablet   Other Relevant Orders   CBC   Comprehensive metabolic panel   TSH   Morbid obesity (HCC)    Encouraged DASH or MIND diet, decrease po intake and increase exercise as tolerated. Needs 7-8 hours of sleep nightly. Avoid trans fats, eat small, frequent meals every 4-5 hours  with lean proteins, complex carbs and healthy fats. Minimize simple carbs, consider HH&W         I have discontinued Candas Umar's Vitamin D (Cholecalciferol) and nitrofurantoin (macrocrystal-monohydrate). I am also having her maintain her hydrocortisone, aspirin EC, NON FORMULARY, NON FORMULARY, ALPRAZolam, and famotidine.  Meds ordered this encounter  Medications  . famotidine (PEPCID) 40 MG tablet    Sig: Take 1 tablet (40 mg total) by mouth daily.    Dispense:  30 tablet    Refill:  5    I discussed the assessment and treatment plan with the patient. The patient was provided an opportunity to ask questions and all were answered. The patient agreed with the plan and demonstrated an understanding of the instructions.   The patient was advised to call back or seek an in-person evaluation if the symptoms worsen or if the condition fails to improve as anticipated.  I provided 25 minutes of non-face-to-face time during this encounter.   Danise Edge, MD

## 2021-01-13 NOTE — Assessment & Plan Note (Signed)
No recent flares endorsed.

## 2021-01-13 NOTE — Assessment & Plan Note (Signed)
Has been worse recently with some weight gain and poor eating habits. Avoid offending foods, start probiotics. Do not eat large meals in late evening and consider raising head of bed. Try Mylanta prn when pain develops. Seek care if no resolution.

## 2021-01-13 NOTE — Assessment & Plan Note (Signed)
She is doing better and her anxiety is well enough she has not needed doses of Alprazolam recently. She is back at work and she feels that helps her

## 2021-01-21 ENCOUNTER — Other Ambulatory Visit: Payer: Self-pay

## 2021-01-21 ENCOUNTER — Other Ambulatory Visit (INDEPENDENT_AMBULATORY_CARE_PROVIDER_SITE_OTHER): Payer: Managed Care, Other (non HMO)

## 2021-01-21 DIAGNOSIS — R002 Palpitations: Secondary | ICD-10-CM | POA: Diagnosis not present

## 2021-01-21 DIAGNOSIS — K219 Gastro-esophageal reflux disease without esophagitis: Secondary | ICD-10-CM | POA: Diagnosis not present

## 2021-01-21 LAB — CBC
HCT: 39.1 % (ref 36.0–46.0)
Hemoglobin: 13.2 g/dL (ref 12.0–15.0)
MCHC: 33.7 g/dL (ref 30.0–36.0)
MCV: 91.3 fl (ref 78.0–100.0)
Platelets: 354 10*3/uL (ref 150.0–400.0)
RBC: 4.28 Mil/uL (ref 3.87–5.11)
RDW: 13.4 % (ref 11.5–15.5)
WBC: 6.9 10*3/uL (ref 4.0–10.5)

## 2021-01-21 LAB — COMPREHENSIVE METABOLIC PANEL
ALT: 11 U/L (ref 0–35)
AST: 14 U/L (ref 0–37)
Albumin: 4.1 g/dL (ref 3.5–5.2)
Alkaline Phosphatase: 59 U/L (ref 39–117)
BUN: 11 mg/dL (ref 6–23)
CO2: 29 mEq/L (ref 19–32)
Calcium: 9.4 mg/dL (ref 8.4–10.5)
Chloride: 100 mEq/L (ref 96–112)
Creatinine, Ser: 0.8 mg/dL (ref 0.40–1.20)
GFR: 87.78 mL/min (ref >60.00)
Glucose, Bld: 94 mg/dL (ref 70–99)
Potassium: 4.1 mEq/L (ref 3.5–5.1)
Sodium: 136 mEq/L (ref 135–145)
Total Bilirubin: 0.6 mg/dL (ref 0.2–1.2)
Total Protein: 6.9 g/dL (ref 6.0–8.3)

## 2021-01-21 LAB — TSH: TSH: 1.5 u[IU]/mL (ref 0.35–4.50)

## 2021-01-21 NOTE — Progress Notes (Signed)
[id number] 

## 2021-01-25 ENCOUNTER — Encounter: Payer: Self-pay | Admitting: Family Medicine

## 2021-03-11 ENCOUNTER — Encounter: Payer: Self-pay | Admitting: Family Medicine

## 2021-07-17 ENCOUNTER — Ambulatory Visit (INDEPENDENT_AMBULATORY_CARE_PROVIDER_SITE_OTHER): Payer: Managed Care, Other (non HMO) | Admitting: Family Medicine

## 2021-07-17 ENCOUNTER — Other Ambulatory Visit: Payer: Self-pay

## 2021-07-17 VITALS — BP 130/82 | HR 78 | Temp 98.7°F | Resp 18 | Ht 68.0 in | Wt 247.4 lb

## 2021-07-17 DIAGNOSIS — R519 Headache, unspecified: Secondary | ICD-10-CM | POA: Diagnosis not present

## 2021-07-17 DIAGNOSIS — R0683 Snoring: Secondary | ICD-10-CM | POA: Diagnosis not present

## 2021-07-17 DIAGNOSIS — K219 Gastro-esophageal reflux disease without esophagitis: Secondary | ICD-10-CM

## 2021-07-17 DIAGNOSIS — Z1231 Encounter for screening mammogram for malignant neoplasm of breast: Secondary | ICD-10-CM | POA: Diagnosis not present

## 2021-07-17 DIAGNOSIS — B372 Candidiasis of skin and nail: Secondary | ICD-10-CM

## 2021-07-17 DIAGNOSIS — Z1159 Encounter for screening for other viral diseases: Secondary | ICD-10-CM

## 2021-07-17 DIAGNOSIS — G479 Sleep disorder, unspecified: Secondary | ICD-10-CM

## 2021-07-17 DIAGNOSIS — Z0000 Encounter for general adult medical examination without abnormal findings: Secondary | ICD-10-CM | POA: Insufficient documentation

## 2021-07-17 DIAGNOSIS — M25561 Pain in right knee: Secondary | ICD-10-CM

## 2021-07-17 DIAGNOSIS — Z1379 Encounter for other screening for genetic and chromosomal anomalies: Secondary | ICD-10-CM | POA: Insufficient documentation

## 2021-07-17 DIAGNOSIS — G8929 Other chronic pain: Secondary | ICD-10-CM

## 2021-07-17 NOTE — Assessment & Plan Note (Addendum)
Patient encouraged to maintain heart healthy diet, regular exercise, adequate sleep. Consider daily probiotics. Take medications as prescribed. Labs ordered and reviewed. MG ordered, immunizations reviewed with patient.

## 2021-07-17 NOTE — Progress Notes (Signed)
Patient ID: Stephanie Dunlap, female    DOB: 06-30-1971  Age: 50 y.o. MRN: [id number]    Subjective:  Subjective  HPI Stephanie Dunlap presents for office visit today for comprehensive physical exam today and follow up on management of chronic concerns. She reports experiencing skin changes and HA's. She states that sometimes she feels pounding HA's when waking up in the morning and endorses snoring during sleep. She endorses FMHx of sleep apnea in Father and her son. At the moment, she did not get a sleep study. Denies CP/palp/SOB/fevers/GI or GU c/o. Taking meds as prescribed. She is suspecting either perimenopause or the life stresses she is experiencing at home that are causing her mood changes. She endorses trying to incorporate more vegan food into her diet and hoping to transition into a began diet. She finds it challenging to move or exercise due to her 3 right knee surgeries and pain.   Review of Systems  Constitutional:  Negative for chills, fatigue and fever.  HENT:  Negative for congestion, rhinorrhea, sinus pressure, sinus pain, sore throat and trouble swallowing.   Eyes:  Negative for pain and visual disturbance.  Respiratory:  Negative for cough and shortness of breath.   Cardiovascular:  Negative for chest pain, palpitations and leg swelling.  Gastrointestinal:  Negative for abdominal pain, blood in stool, diarrhea, nausea and vomiting.  Genitourinary:  Negative for decreased urine volume, flank pain, frequency, vaginal bleeding and vaginal discharge.  Musculoskeletal:  Negative for back pain.  Neurological:  Positive for headaches.   History Past Medical History:  Diagnosis Date   Chicken pox    GERD (gastroesophageal reflux disease)    History of chicken pox    Hyperlipidemia    no meds    Obesity    PTSD (post-traumatic stress disorder)    daughter had cardiac arrest from covid    She has a past surgical history that includes Knee surgery (Right); Partial hysterectomy;  Gynecologic cryosurgery; Abdominal hysterectomy; and Cervix lesion destruction.   Her family history includes Atrial fibrillation in her brother; Cancer in her paternal grandfather; Depression in her sister; Diabetes in her maternal grandmother and mother; Heart disease in her daughter, maternal grandfather, and son; Hyperlipidemia in her mother; Hypertension in her brother, father, maternal grandmother, paternal grandfather, and paternal grandmother; Prostate cancer in her paternal grandfather; Sleep apnea in her brother; Stroke in her daughter; Thyroid disease in her paternal grandmother.She reports that she quit smoking about 5 years ago. Her smoking use included cigarettes. She has a 0.50 pack-year smoking history. She has never used smokeless tobacco. She reports current alcohol use. She reports that she does not use drugs.  Current Outpatient Medications on File Prior to Visit  Medication Sig Dispense Refill   ALPRAZolam (XANAX) 0.25 MG tablet Take 1 tablet (0.25 mg total) by mouth 2 (two) times daily as needed for anxiety. 20 tablet 0   aspirin EC 81 MG tablet Take 81 mg by mouth daily.     hydrocortisone 2.5 % cream Apply topically as needed.     famotidine (PEPCID) 40 MG tablet Take 1 tablet (40 mg total) by mouth daily. (Patient not taking: Reported on 07/17/2021) 30 tablet 5   No current facility-administered medications on file prior to visit.     Objective:  Objective  Physical Exam Constitutional:      General: She is not in acute distress.    Appearance: Normal appearance. She is not ill-appearing or toxic-appearing.  HENT:     Head:  Normocephalic and atraumatic.     Right Ear: Tympanic membrane, ear canal and external ear normal.     Left Ear: Tympanic membrane, ear canal and external ear normal.     Nose: No congestion or rhinorrhea.  Eyes:     Extraocular Movements: Extraocular movements intact.     Right eye: No nystagmus.     Left eye: No nystagmus.     Pupils: Pupils  are equal, round, and reactive to light.  Cardiovascular:     Rate and Rhythm: Normal rate and regular rhythm.     Pulses: Normal pulses.     Heart sounds: Normal heart sounds. No murmur heard. Pulmonary:     Effort: Pulmonary effort is normal. No respiratory distress.     Breath sounds: Normal breath sounds. No wheezing, rhonchi or rales.  Abdominal:     General: Bowel sounds are normal.     Palpations: Abdomen is soft. There is no mass.     Tenderness: no abdominal tenderness There is no guarding.     Hernia: No hernia is present.  Musculoskeletal:        General: Normal range of motion.     Cervical back: Normal range of motion and neck supple.  Skin:    General: Skin is warm and dry.  Neurological:     Mental Status: She is alert and oriented to person, place, and time.     Cranial Nerves: No facial asymmetry.     Sensory: Sensation is intact.     Motor: Motor function is intact. No weakness.  Psychiatric:        Behavior: Behavior normal.   BP 130/82 (BP Location: Right Arm, Patient Position: Sitting, Cuff Size: Large)   Pulse 78   Temp 98.7 F (37.1 C) (Oral)   Resp 18   Ht 5\' 8"  (1.727 m)   Wt 247 lb 6.4 oz (112.2 kg)   SpO2 99%   BMI 37.62 kg/m  Wt Readings from Last 3 Encounters:  07/17/21 247 lb 6.4 oz (112.2 kg)  11/07/19 245 lb (111.1 kg)  09/04/19 245 lb 6 oz (111.3 kg)     Lab Results  Component Value Date   WBC 8.0 07/17/2021   HGB 13.1 07/17/2021   HCT 38.7 07/17/2021   PLT 369.0 07/17/2021   GLUCOSE 89 07/17/2021   CHOL 224 (H) 07/17/2021   TRIG 277.0 (H) 07/17/2021   HDL 36.30 (L) 07/17/2021   LDLDIRECT 154.0 07/17/2021   ALT 13 07/17/2021   AST 17 07/17/2021   NA 138 07/17/2021   K 4.1 07/17/2021   CL 101 07/17/2021   CREATININE 0.77 07/17/2021   BUN 9 07/17/2021   CO2 28 07/17/2021   TSH 1.44 07/17/2021   HGBA1C 6.1 07/17/2021    ECHOCARDIOGRAM COMPLETE  Result Date: 09/07/2019   ECHOCARDIOGRAM REPORT   Patient Name:   Stephanie Dunlap Date of Exam: 09/07/2019 Medical Rec #:  09/09/2019      Height:       68.0 in Accession #:    [id number]     Weight:       245.4 lb Date of Birth:  20-Aug-1971     BSA:          2.23 m Patient Age:    47 years       BP:           118/94 mmHg Patient Gender: F  HR:           83 bpm. Exam Location:  [facility] Procedure: 2D Echo, Cardiac Doppler and Color Doppler Indications:    Dyspnea  History:        Patient has no prior history of Echocardiogram examinations.  Sonographer:    Sinda Du RDCS (AE) Referring Phys: [id number] STACEY A BLYTH IMPRESSIONS  1. Left ventricular ejection fraction, by visual estimation, is 60 to 65%. The left ventricle has normal function. Normal left ventricular size. There is no left ventricular hypertrophy.  2. Global right ventricle has normal systolic function.The right ventricular size is normal. No increase in right ventricular wall thickness.  3. Left atrial size was normal.  4. Right atrial size was normal.  5. The mitral valve is normal in structure. Mild mitral valve regurgitation. No evidence of mitral stenosis.  6. The tricuspid valve is normal in structure. Tricuspid valve regurgitation was not visualized by color flow Doppler.  7. The aortic valve is normal in structure. Aortic valve regurgitation was not visualized by color flow Doppler. Structurally normal aortic valve, with no evidence of sclerosis or stenosis.  8. The pulmonic valve was normal in structure. Pulmonic valve regurgitation is not visualized by color flow Doppler.  9. Normal pulmonary artery systolic pressure. FINDINGS  Left Ventricle: Left ventricular ejection fraction, by visual estimation, is 60 to 65%. The left ventricle has normal function. There is no left ventricular hypertrophy. Normal left ventricular size. Right Ventricle: The right ventricular size is normal. No increase in right ventricular wall thickness. Global RV systolic function is has normal systolic function. The tricuspid  regurgitant velocity is 1.79 m/s, and with an assumed right atrial pressure  of 3 mmHg, the estimated right ventricular systolic pressure is normal at 15.8 mmHg. Left Atrium: Left atrial size was normal in size. Right Atrium: Right atrial size was normal in size Pericardium: There is no evidence of pericardial effusion. Mitral Valve: The mitral valve is normal in structure. No evidence of mitral valve stenosis by observation. Mild mitral valve regurgitation. Tricuspid Valve: The tricuspid valve is normal in structure. Tricuspid valve regurgitation was not visualized by color flow Doppler. Aortic Valve: The aortic valve is normal in structure. Aortic valve regurgitation was not visualized by color flow Doppler. The aortic valve is structurally normal, with no evidence of sclerosis or stenosis. Pulmonic Valve: The pulmonic valve was normal in structure. Pulmonic valve regurgitation is not visualized by color flow Doppler. Aorta: The aortic root, ascending aorta and aortic arch are all structurally normal, with no evidence of dilitation or obstruction. Venous: The inferior vena cava is normal in size with greater than 50% respiratory variability, suggesting right atrial pressure of 3 mmHg. IAS/Shunts: No atrial level shunt detected by color flow Doppler. No ventricular septal defect is seen or detected. There is no evidence of an atrial septal defect.  LEFT VENTRICLE PLAX 2D LVIDd:         4.19 cm  Diastology LVIDs:         2.43 cm  LV e' lateral:   10.70 cm/s LV PW:         0.97 cm  LV E/e' lateral: 6.8 LV IVS:        1.15 cm  LV e' medial:    8.27 cm/s LVOT diam:     2.20 cm  LV E/e' medial:  8.8 LV SV:         57 ml LV SV Index:   24.32 LVOT Area:  3.80 cm  RIGHT VENTRICLE             IVC RV Basal diam:  2.60 cm     IVC diam: 1.96 cm RV S prime:     10.10 cm/s TAPSE (M-mode): 1.9 cm LEFT ATRIUM             Index       RIGHT ATRIUM           Index LA diam:        3.50 cm 1.57 cm/m  RA Area:     14.60 cm LA Vol  (A2C):   56.6 ml 25.39 ml/m RA Volume:   34.30 ml  15.39 ml/m LA Vol (A4C):   30.4 ml 13.64 ml/m LA Biplane Vol: 42.7 ml 19.15 ml/m  AORTIC VALVE LVOT Vmax:   124.00 cm/s LVOT Vmean:  86.000 cm/s LVOT VTI:    0.236 m  AORTA Ao Root diam: 2.50 cm Ao Asc diam:  3.40 cm MITRAL VALVE                        TRICUSPID VALVE MV Area (PHT): 3.93 cm             TR Peak grad:   12.8 mmHg MV PHT:        55.97 msec           TR Vmax:        179.00 cm/s MV Decel Time: 193 msec MV E velocity: 72.80 cm/s 103 cm/s  SHUNTS MV A velocity: 56.10 cm/s 70.3 cm/s Systemic VTI:  0.24 m MV E/A ratio:  1.30       1.5       Systemic Diam: 2.20 cm  Kardie Tobb DO Electronically signed by Thomasene Ripple DO Signature Date/Time: 09/07/2019/6:11:00 PM    Final      Assessment & Plan:  Plan    No orders of the defined types were placed in this encounter.   Problem List Items Addressed This Visit     Acid reflux    Avoid offending foods, start probiotics. Do not eat large meals in late evening and consider raising head of bed.       Morbid obesity (HCC)    Encouraged DASH or MIND diet, decrease po intake and increase exercise as tolerated. Needs 7-8 hours of sleep nightly. Avoid trans fats, eat small, frequent meals every 4-5 hours with lean proteins, complex carbs and healthy fats. Minimize simple carbs, high fat foods and processed foods. Consider Wegovy or Saxenda      Preventative health care - Primary    Patient encouraged to maintain heart healthy diet, regular exercise, adequate sleep. Consider daily probiotics. Take medications as prescribed. Labs ordered and reviewed. MG ordered, immunizations reviewed with patient.       Relevant Orders   Hemoglobin A1c (Completed)   CBC with Differential/Platelet (Completed)   Comprehensive metabolic panel (Completed)   Lipid panel (Completed)   TSH (Completed)   Candidal skin infection    Family member with good response to Diflucan with similar rash and itching. Will  stry Fluconazole 150 mg po q week and consider referral to derm if no improvement      Nonintractable headache    With snoring and am HAs. Referred to pulmonology for further consideration.       Relevant Orders   Ambulatory referral to Pulmonology   Right knee pain    Stay active and use  topical treatments. Report if ready for referral for specialty care and/or imaging      Other Visit Diagnoses     Snoring       Relevant Orders   Ambulatory referral to Pulmonology   Restless sleeper       Relevant Orders   Ambulatory referral to Pulmonology   Encounter for hepatitis C screening test for low risk patient       Relevant Orders   Hepatitis C Antibody (Completed)   Encounter for screening mammogram for malignant neoplasm of breast       Relevant Orders   MM 3D SCREEN BREAST BILATERAL       Follow-up: Return in about 6 months (around 01/17/2022).  I, Billie Lade, acting as a scribe for Danise Edge, MD, have documented all relevent documentation on behalf of Danise Edge, MD, as directed by Danise Edge, MD while in the presence of Danise Edge, MD.  I, Bradd Canary, MD personally performed the services described in this documentation. All medical record entries made by the scribe were at my direction and in my presence. I have reviewed the chart and agree that the record reflects my personal performance and is accurate and complete

## 2021-07-17 NOTE — Assessment & Plan Note (Signed)
Avoid offending foods, start probiotics. Do not eat large meals in late evening and consider raising head of bed.  

## 2021-07-17 NOTE — Patient Instructions (Addendum)
Encouraged DASH or MIND diet, decrease po intake and increase exercise as tolerated. Needs 7-8 hours of sleep nightly. Avoid trans fats, eat small, frequent meals every 4-5 hours with lean proteins, complex carbs and healthy fats. Minimize simple carbs, high fat foods and processed foods    Paxlovid is the new COVID medication we can give you if you get COVID so make sure you test if you have symptoms because we have to treat by day 5 of symptoms for it to be effective. If you are positive let us know so we can treat. If a home test is negative and your symptoms are persistent get a PCR test. Can check testing locations at [email or web address] If you are positive we will make an appointment with [place] and we will send in Paxlovid if you would like it. Check with your pharmacy before we meet to confirm they have it in stock, if they do not then we can get the prescription at the [facility]   Preventive Care 24-53 Years Old, Female Preventive care refers to lifestyle choices and visits with your health care provider that can promote health and wellness. This includes: A yearly physical exam. This is also called an annual wellness visit. Regular dental and eye exams. Immunizations. Screening for certain conditions. Healthy lifestyle choices, such as: Eating a healthy diet. Getting regular exercise. Not using drugs or products that contain nicotine and tobacco. Limiting alcohol use. What can I expect for my preventive care visit? Physical exam Your health care provider will check your: Height and weight. These may be used to calculate your BMI (body mass index). BMI is a measurement that tells if you are at a healthy weight. Heart rate and blood pressure. Body temperature. Skin for abnormal spots. Counseling Your health care provider may ask you questions about your: Past medical problems. Family's medical history. Alcohol, tobacco, and drug use. Emotional well-being. Home life  and relationship well-being. Sexual activity. Diet, exercise, and sleep habits. Work and work Statistician. Access to firearms. Method of birth control. Menstrual cycle. Pregnancy history. What immunizations do I need?  Vaccines are usually given at various ages, according to a schedule. Your health care provider will recommend vaccines for you based on your age, medicalhistory, and lifestyle or other factors, such as travel or where you work. What tests do I need? Blood tests Lipid and cholesterol levels. These may be checked every 5 years, or more often if you are over 56 years old. Hepatitis C test. Hepatitis B test. Screening Lung cancer screening. You may have this screening every year starting at age 41 if you have a 30-pack-year history of smoking and currently smoke or have quit within the past 15 years. Colorectal cancer screening. All adults should have this screening starting at age 46 and continuing until age 50. Your health care provider may recommend screening at age 74 if you are at increased risk. You will have tests every 1-10 years, depending on your results and the type of screening test. Diabetes screening. This is done by checking your blood sugar (glucose) after you have not eaten for a while (fasting). You may have this done every 1-3 years. Mammogram. This may be done every 1-2 years. Talk with your health care provider about when you should start having regular mammograms. This may depend on whether you have a family history of breast cancer. BRCA-related cancer screening. This may be done if you have a family history of breast, ovarian, tubal, or peritoneal cancers.  Pelvic exam and Pap test. This may be done every 3 years starting at age 80. Starting at age 65, this may be done every 5 years if you have a Pap test in combination with an HPV test. Other tests STD (sexually transmitted disease) testing, if you are at risk. Bone density scan. This is done to  screen for osteoporosis. You may have this scan if you are at high risk for osteoporosis. Talk with your health care provider about your test results, treatment options,and if necessary, the need for more tests. Follow these instructions at home: Eating and drinking  Eat a diet that includes fresh fruits and vegetables, whole grains, lean protein, and low-fat dairy products. Take vitamin and mineral supplements as recommended by your health care provider. Do not drink alcohol if: Your health care provider tells you not to drink. You are pregnant, may be pregnant, or are planning to become pregnant. If you drink alcohol: Limit how much you have to 0-1 drink a day. Be aware of how much alcohol is in your drink. In the [place] one drink equals one 12 oz bottle of beer (355 mL), one 5 oz glass of wine (148 mL), or one 1 oz glass of hard liquor (44 mL).  Lifestyle Take daily care of your teeth and gums. Brush your teeth every morning and night with fluoride toothpaste. Floss one time each day. Stay active. Exercise for at least 30 minutes 5 or more days each week. Do not use any products that contain nicotine or tobacco, such as cigarettes, e-cigarettes, and chewing tobacco. If you need help quitting, ask your health care provider. Do not use drugs. If you are sexually active, practice safe sex. Use a condom or other form of protection to prevent STIs (sexually transmitted infections). If you do not wish to become pregnant, use a form of birth control. If you plan to become pregnant, see your health care provider for a prepregnancy visit. If told by your health care provider, take low-dose aspirin daily starting at age 42. Find healthy ways to cope with stress, such as: Meditation, yoga, or listening to music. Journaling. Talking to a trusted person. Spending time with friends and family. Safety Always wear your seat belt while driving or riding in a vehicle. Do not drive: If you have been  drinking alcohol. Do not ride with someone who has been drinking. When you are tired or distracted. While texting. Wear a helmet and other protective equipment during sports activities. If you have firearms in your house, make sure you follow all gun safety procedures. What's next? Visit your health care provider once a year for an annual wellness visit. Ask your health care provider how often you should have your eyes and teeth checked. Stay up to date on all vaccines. This information is not intended to replace advice given to you by your health care provider. Make sure you discuss any questions you have with your healthcare provider. Document Revised: 08/13/2020 Document Reviewed: 07/21/2018 Elsevier Patient Education  2022 [employer].

## 2021-07-18 ENCOUNTER — Encounter: Payer: Self-pay | Admitting: Family Medicine

## 2021-07-18 LAB — COMPREHENSIVE METABOLIC PANEL
ALT: 13 U/L (ref 0–35)
AST: 17 U/L (ref 0–37)
Albumin: 4.2 g/dL (ref 3.5–5.2)
Alkaline Phosphatase: 62 U/L (ref 39–117)
BUN: 9 mg/dL (ref 6–23)
CO2: 28 mEq/L (ref 19–32)
Calcium: 9.4 mg/dL (ref 8.4–10.5)
Chloride: 101 mEq/L (ref 96–112)
Creatinine, Ser: 0.77 mg/dL (ref 0.40–1.20)
GFR: 91.58 mL/min (ref >60.00)
Glucose, Bld: 89 mg/dL (ref 70–99)
Potassium: 4.1 mEq/L (ref 3.5–5.1)
Sodium: 138 mEq/L (ref 135–145)
Total Bilirubin: 0.5 mg/dL (ref 0.2–1.2)
Total Protein: 7.1 g/dL (ref 6.0–8.3)

## 2021-07-18 LAB — HEPATITIS C ANTIBODY
Hepatitis C Ab: NONREACTIVE
SIGNAL TO CUT-OFF: 0.01 (ref <1.00)

## 2021-07-18 LAB — CBC WITH DIFFERENTIAL/PLATELET
Basophils Absolute: 0 10*3/uL (ref 0.0–0.1)
Basophils Relative: 0.5 % (ref 0.0–3.0)
Eosinophils Absolute: 0.1 10*3/uL (ref 0.0–0.7)
Eosinophils Relative: 1.8 % (ref 0.0–5.0)
HCT: 38.7 % (ref 36.0–46.0)
Hemoglobin: 13.1 g/dL (ref 12.0–15.0)
Lymphocytes Relative: 33 % (ref 12.0–46.0)
Lymphs Abs: 2.6 10*3/uL (ref 0.7–4.0)
MCHC: 33.8 g/dL (ref 30.0–36.0)
MCV: 90 fl (ref 78.0–100.0)
Monocytes Absolute: 0.7 10*3/uL (ref 0.1–1.0)
Monocytes Relative: 8.4 % (ref 3.0–12.0)
Neutro Abs: 4.5 10*3/uL (ref 1.4–7.7)
Neutrophils Relative %: 56.3 % (ref 43.0–77.0)
Platelets: 369 10*3/uL (ref 150.0–400.0)
RBC: 4.3 Mil/uL (ref 3.87–5.11)
RDW: 13.9 % (ref 11.5–15.5)
WBC: 8 10*3/uL (ref 4.0–10.5)

## 2021-07-18 LAB — LIPID PANEL
Cholesterol: 224 mg/dL — ABNORMAL HIGH (ref 0–200)
HDL: 36.3 mg/dL — ABNORMAL LOW (ref >39.00)
NonHDL: 187.68
Total CHOL/HDL Ratio: 6
Triglycerides: 277 mg/dL — ABNORMAL HIGH (ref 0.0–149.0)
VLDL: 55.4 mg/dL — ABNORMAL HIGH (ref 0.0–40.0)

## 2021-07-18 LAB — LDL CHOLESTEROL, DIRECT: Direct LDL: 154 mg/dL

## 2021-07-18 LAB — TSH: TSH: 1.44 u[IU]/mL (ref 0.35–5.50)

## 2021-07-18 LAB — HEMOGLOBIN A1C: Hgb A1c MFr Bld: 6.1 % (ref 4.6–6.5)

## 2021-07-20 ENCOUNTER — Other Ambulatory Visit: Payer: Self-pay | Admitting: Family Medicine

## 2021-07-20 DIAGNOSIS — R519 Headache, unspecified: Secondary | ICD-10-CM | POA: Insufficient documentation

## 2021-07-20 DIAGNOSIS — B372 Candidiasis of skin and nail: Secondary | ICD-10-CM | POA: Insufficient documentation

## 2021-07-20 DIAGNOSIS — M25561 Pain in right knee: Secondary | ICD-10-CM | POA: Insufficient documentation

## 2021-07-20 MED ORDER — FLUCONAZOLE 150 MG PO TABS: 150.0000 mg | ORAL_TABLET | ORAL | 1 refills | Status: DC

## 2021-07-20 NOTE — Assessment & Plan Note (Signed)
With snoring and am HAs. Referred to pulmonology for further consideration.

## 2021-07-20 NOTE — Assessment & Plan Note (Signed)
Encouraged DASH or MIND diet, decrease po intake and increase exercise as tolerated. Needs 7-8 hours of sleep nightly. Avoid trans fats, eat small, frequent meals every 4-5 hours with lean proteins, complex carbs and healthy fats. Minimize simple carbs, high fat foods and processed foods. Consider Wegovy or Saxenda °

## 2021-07-20 NOTE — Assessment & Plan Note (Signed)
Stay active and use topical treatments. Report if ready for referral for specialty care and/or imaging

## 2021-07-20 NOTE — Assessment & Plan Note (Signed)
Family member with good response to Diflucan with similar rash and itching. Will stry Fluconazole 150 mg po q week and consider referral to derm if no improvement

## 2021-07-21 ENCOUNTER — Other Ambulatory Visit: Payer: Self-pay

## 2021-07-21 ENCOUNTER — Encounter (HOSPITAL_BASED_OUTPATIENT_CLINIC_OR_DEPARTMENT_OTHER): Payer: Self-pay

## 2021-07-21 ENCOUNTER — Ambulatory Visit (HOSPITAL_BASED_OUTPATIENT_CLINIC_OR_DEPARTMENT_OTHER)
Admission: RE | Admit: 2021-07-21 | Discharge: 2021-07-21 | Disposition: A | Discharge status: Home / Self Care | Payer: Managed Care, Other (non HMO) | Source: Ambulatory Visit | Attending: Family Medicine | Admitting: Family Medicine

## 2021-07-21 DIAGNOSIS — Z1231 Encounter for screening mammogram for malignant neoplasm of breast: Secondary | ICD-10-CM | POA: Diagnosis not present

## 2021-08-11 ENCOUNTER — Other Ambulatory Visit: Payer: Self-pay | Admitting: Family Medicine

## 2021-08-11 DIAGNOSIS — L309 Dermatitis, unspecified: Secondary | ICD-10-CM

## 2021-08-11 MED ORDER — KETOCONAZOLE 2 % EX SHAM: 1.0000 "application " | MEDICATED_SHAMPOO | CUTANEOUS | 1 refills | Status: DC

## 2021-12-09 ENCOUNTER — Institutional Professional Consult (permissible substitution): Payer: Managed Care, Other (non HMO) | Admitting: Pulmonary Disease

## 2022-01-19 ENCOUNTER — Ambulatory Visit: Payer: Managed Care, Other (non HMO) | Admitting: Family Medicine

## 2022-01-20 ENCOUNTER — Encounter: Payer: Self-pay | Admitting: Family Medicine

## 2022-01-20 ENCOUNTER — Ambulatory Visit: Payer: Managed Care, Other (non HMO) | Admitting: Family Medicine

## 2022-01-20 ENCOUNTER — Ambulatory Visit (INDEPENDENT_AMBULATORY_CARE_PROVIDER_SITE_OTHER): Payer: Self-pay | Admitting: Family Medicine

## 2022-01-20 VITALS — BP 123/83 | HR 73 | Ht 68.0 in | Wt 244.0 lb

## 2022-01-20 DIAGNOSIS — E782 Mixed hyperlipidemia: Secondary | ICD-10-CM

## 2022-01-20 DIAGNOSIS — E669 Obesity, unspecified: Secondary | ICD-10-CM

## 2022-01-20 DIAGNOSIS — K219 Gastro-esophageal reflux disease without esophagitis: Secondary | ICD-10-CM

## 2022-01-20 DIAGNOSIS — R7303 Prediabetes: Secondary | ICD-10-CM

## 2022-01-20 DIAGNOSIS — F4310 Post-traumatic stress disorder, unspecified: Secondary | ICD-10-CM

## 2022-01-20 LAB — COMPREHENSIVE METABOLIC PANEL
ALT: 17 U/L (ref 0–35)
AST: 18 U/L (ref 0–37)
Albumin: 4.3 g/dL (ref 3.5–5.2)
Alkaline Phosphatase: 62 U/L (ref 39–117)
BUN: 10 mg/dL (ref 6–23)
CO2: 30 mEq/L (ref 19–32)
Calcium: 9.8 mg/dL (ref 8.4–10.5)
Chloride: 102 mEq/L (ref 96–112)
Creatinine, Ser: 0.82 mg/dL (ref 0.40–1.20)
GFR: 84.62 mL/min (ref >60.00)
Glucose, Bld: 90 mg/dL (ref 70–99)
Potassium: 4.6 mEq/L (ref 3.5–5.1)
Sodium: 139 mEq/L (ref 135–145)
Total Bilirubin: 0.5 mg/dL (ref 0.2–1.2)
Total Protein: 7.1 g/dL (ref 6.0–8.3)

## 2022-01-20 LAB — CBC
HCT: 38.4 % (ref 36.0–46.0)
Hemoglobin: 12.7 g/dL (ref 12.0–15.0)
MCHC: 33.2 g/dL (ref 30.0–36.0)
MCV: 89.8 fl (ref 78.0–100.0)
Platelets: 337 10*3/uL (ref 150.0–400.0)
RBC: 4.27 Mil/uL (ref 3.87–5.11)
RDW: 13.5 % (ref 11.5–15.5)
WBC: 6.2 10*3/uL (ref 4.0–10.5)

## 2022-01-20 LAB — LIPID PANEL
Cholesterol: 220 mg/dL — ABNORMAL HIGH (ref 0–200)
HDL: 39.7 mg/dL (ref >39.00)
LDL Cholesterol: 144 mg/dL — ABNORMAL HIGH (ref 0–99)
NonHDL: 180.34
Total CHOL/HDL Ratio: 6
Triglycerides: 184 mg/dL — ABNORMAL HIGH (ref 0.0–149.0)
VLDL: 36.8 mg/dL (ref 0.0–40.0)

## 2022-01-20 LAB — HEMOGLOBIN A1C: Hgb A1c MFr Bld: 6.2 % (ref 4.6–6.5)

## 2022-01-20 LAB — TSH: TSH: 1.72 u[IU]/mL (ref 0.35–5.50)

## 2022-01-20 NOTE — Progress Notes (Signed)
Established Patient Office Visit  Subjective:  Patient ID: Stephanie Dunlap, female    DOB: [birth year]  Age: 51 y.o. MRN: [id number]  CC: Follow-up   HPI Stephanie Dunlap presents for routine follow-up. Last saw PCP 6 months ago for CPE. No new complaints/concerns.  HYPERLIPIDEMIA - medications: none - compliance: none - medication SEs: n/a The 10-year ASCVD risk score (Arnett DK, et al., 2019) is: 2.8%   Values used to calculate the score:     Age: 77 years     Sex: Female     Is Non-Hispanic African American: Yes     Diabetic: No     Tobacco smoker: No     Systolic Blood Pressure: [id number]     Is BP treated: No     HDL Cholesterol: 36.3 mg/dL     Total Cholesterol: 224 mg/dL  PREDIABETES/OBESITY: - Weight is stagnant; only down 2-3 lbs in past 6 months; has been going to gym more consistently for the past month, but admits to struggling with diet/portions - 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 Would like basic labs today to ensure this would be a safe option for her.  - Denies symptoms of hypoglycemia, polyuria, polydipsia, numbness extremities, foot ulcers/trauma, wounds that are not healing, medication side effects   ACID REFLUX: Not regularly taking meds, only takes as needed. Doing much better since focusing on not eating heavy meals late at night.   PTSD: Very rare xanax. Does not need refill. Doing well overall. PHQ2 = 0  No SI/HI   RESTLESS SLEEP/SNORING/HEADACHES: Sleep study coming up next month with pulmonology. No new concerns.         Past Medical History:  Diagnosis Date   Chicken pox    GERD (gastroesophageal reflux disease)    History of chicken pox    Hyperlipidemia    no meds    Obesity    PTSD (post-traumatic stress disorder)    daughter had cardiac arrest from covid    Past Surgical History:  Procedure Laterality Date   ABDOMINAL HYSTERECTOMY      ovaries left in place   CERVIX LESION DESTRUCTION     GYNECOLOGIC CRYOSURGERY     KNEE SURGERY Right    PARTIAL HYSTERECTOMY      Family History  Problem Relation Age of Onset   Prostate cancer Paternal Grandfather    Hypertension Paternal Grandfather    Cancer Paternal Grandfather        prostate   Diabetes Maternal Grandmother    Hypertension Maternal Grandmother    Diabetes Mother    Hyperlipidemia Mother    Hypertension Father    Atrial fibrillation Brother    Hypertension Brother    Sleep apnea Brother    Stroke Daughter    Heart disease Daughter        cardiac arrest during COVID with defib   Depression Sister        PTSD   Heart disease Son        arrythmia?   Heart disease Maternal Grandfather        MI   Hypertension Paternal Grandmother    Thyroid disease Paternal Grandmother    Breast cancer Neg Hx    Colon cancer Neg Hx    Esophageal cancer Neg Hx    Stomach cancer Neg Hx    Rectal cancer Neg Hx     Social History  Socioeconomic History   Marital status: Married    Spouse name: Not on file   Number of children: 4   Years of education: Not on file   Highest education level: Not on file  Occupational History   Occupation: nurse  Tobacco Use   Smoking status: Former    Packs/day: 0.25    Years: 2.00    Pack years: 0.50    Types: Cigarettes    Quit date: 08/13/2015    Years since quitting: 6.4   Smokeless tobacco: Never  Vaping Use   Vaping Use: Never used  Substance and Sexual Activity   Alcohol use: Yes    Alcohol/week: 0.0 standard drinks    Comment: occ glass of wine   Drug use: No   Sexual activity: Not on file  Other Topics Concern   Not on file  Social History Narrative   Lives with husband, 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 20 tablet 0   aspirin EC 81 MG tablet Take 81 mg by mouth daily.     famotidine (PEPCID) 40 MG tablet Take 1 tablet (40 mg total) by mouth daily. (Patient not taking: Reported on 07/17/2021) 30 tablet 5   hydrocortisone 2.5 % cream Apply topically as needed.     ketoconazole (NIZORAL) 2 % shampoo Apply 1 application topically 2 (two) times a week. 120 mL 1   fluconazole (DIFLUCAN) 150 MG tablet Take 1 tablet (150 mg total) by mouth once a week. 2 tablet 1   No facility-administered medications prior to visit.    No Known Allergies  ROS Review of Systems All review of systems negative except what is listed in the HPI    Objective:    Physical Exam Vitals reviewed.  Constitutional:      Appearance: Normal appearance. She is obese.  HENT:     Head: Normocephalic and atraumatic.  Cardiovascular:     Rate and Rhythm: Normal rate and regular rhythm.     Pulses: Normal pulses.     Heart sounds: Normal heart sounds.  Pulmonary:     Effort: Pulmonary effort is normal.     Breath sounds: Normal breath sounds.  Musculoskeletal:     Cervical back: Normal range of motion and neck supple. No tenderness.  Lymphadenopathy:     Cervical: No cervical adenopathy.  Skin:    General: Skin is warm and dry.  Neurological:     General: No focal deficit present.     Mental Status: She is alert and oriented to person, place, and time. Mental status is at baseline.  Psychiatric:        Mood and Affect: Mood normal.        Behavior: Behavior normal.        Thought Content: Thought content normal.        Judgment: Judgment  normal.    BP  123/83    Pulse 73    Ht 5\' 8"  (1.727 m)    Wt 244 lb (110.7 kg)    BMI 37.10 kg/m  Wt Readings from Last 3 Encounters:  01/20/22 244 lb (110.7 kg)  07/17/21 247 lb 6.4 oz (112.2 kg)  11/07/19 245 lb (111.1 kg)     Health Maintenance Due  Topic Date Due   HIV Screening  Never done   PAP SMEAR-Modifier  Never done   COVID-19 Vaccine (4 - Booster for Moderna series) 09/21/2020    There are no preventive care reminders to display for this patient.  Lab Results  Component Value Date   TSH 1.44 07/17/2021   Lab Results  Component Value Date   WBC 8.0 07/17/2021   HGB 13.1 07/17/2021   HCT 38.7 07/17/2021   MCV 90.0 07/17/2021   PLT 369.0 07/17/2021   Lab Results  Component Value Date   NA 138 07/17/2021   K 4.1 07/17/2021   CO2 28 07/17/2021   GLUCOSE 89 07/17/2021   BUN 9 07/17/2021   CREATININE 0.77 07/17/2021   BILITOT 0.5 07/17/2021   ALKPHOS 62 07/17/2021   AST 17 07/17/2021   ALT 13 07/17/2021   PROT 7.1 07/17/2021   ALBUMIN 4.2 07/17/2021   CALCIUM 9.4 07/17/2021   ANIONGAP 7 04/21/2013   GFR 91.58 07/17/2021   Lab Results  Component Value Date   CHOL 224 (H) 07/17/2021   Lab Results  Component Value Date   HDL 36.30 (L) 07/17/2021   No results found for: [facility] Lab Results  Component Value Date   TRIG 277.0 (H) 07/17/2021   Lab Results  Component Value Date   CHOLHDL 6 07/17/2021   Lab Results  Component Value Date   HGBA1C 6.1 07/17/2021      Assessment & Plan:   Acid reflux Doing much better. Continue lifestyle modifications and PRN Pepcid. Avoid heavy meals, especially late at night.   PTSD (post-traumatic stress disorder) Doing well. No SI/HI. Has not been needing any Xanax lately.   Morbid obesity ([place]) Updating labs today. Continue DASH diet, portion control, and regular exercise. She is planning to look into her coworkers new weight loss program with Ozempic. If unable to participate, consider Wegovy.    Mixed hyperlipidemia Slightly elevated in the past. No daily medication indicated based on previous ASCVD risk score. Updating labs today. Continue heart healthy diet and exercise.   Prediabetes A1c = 6.1% 6 months ago Rechecking labs today.  Continue DASH, low-carb diet and regular exercise.  Patient aware of signs/symptoms requiring further/urgent evaluation.    Orders Placed This Encounter  Procedures   CBC   Comprehensive metabolic panel   Lipid panel   TSH   Hemoglobin A1c      Follow-up: Return in about 6 months (around 07/20/2022) for CPE w PCP.    Terrilyn Saver, NP

## 2022-01-20 NOTE — Patient Instructions (Signed)
Continue current regimen for now. Labs updated today. We will let you know results.

## 2022-01-20 NOTE — Assessment & Plan Note (Signed)
Updating labs today. Continue DASH diet, portion control, and regular exercise. She is planning to look into her coworkers new weight loss program with Ozempic. If unable to participate, consider Wegovy.

## 2022-01-20 NOTE — Assessment & Plan Note (Signed)
Doing much better. Continue lifestyle modifications and PRN Pepcid. Avoid heavy meals, especially late at night.

## 2022-01-20 NOTE — Assessment & Plan Note (Signed)
Doing well. No SI/HI. Has not been needing any Xanax lately.

## 2022-01-20 NOTE — Assessment & Plan Note (Signed)
A1c = 6.1% 6 months ago Rechecking labs today.  Continue DASH, low-carb diet and regular exercise.  Patient aware of signs/symptoms requiring further/urgent evaluation.

## 2022-01-20 NOTE — Assessment & Plan Note (Signed)
Slightly elevated in the past. No daily medication indicated based on previous ASCVD risk score. Updating labs today. Continue heart healthy diet and exercise.

## 2022-01-21 NOTE — Progress Notes (Signed)
Cholesterol is still elevated but slightly improved from last year. Continue with heart healthy (DASH) diet and regular exercise.  Blood counts, metabolic panel, thyroid are normal.  A1c is just barely higher than it was last time. Still in the prediabetic range. Try to get regular exercise, minimize carbs/sugars.   The 10-year ASCVD risk score (Arnett DK, et al., 2019) is: 2.4%   Values used to calculate the score:     Age: 51 years     Sex: Female     Is Non-Hispanic African American: Yes     Diabetic: No     Tobacco smoker: No     Systolic Blood Pressure: 123 mmHg     Is BP treated: No     HDL Cholesterol: 39.7 mg/dL     Total Cholesterol: 220 mg/dL

## 2022-02-02 ENCOUNTER — Encounter: Payer: Self-pay | Admitting: Pulmonary Disease

## 2022-02-02 ENCOUNTER — Other Ambulatory Visit: Payer: Self-pay

## 2022-02-02 ENCOUNTER — Ambulatory Visit (INDEPENDENT_AMBULATORY_CARE_PROVIDER_SITE_OTHER): Payer: Managed Care, Other (non HMO) | Admitting: Pulmonary Disease

## 2022-02-02 VITALS — BP 126/82 | HR 74 | Temp 97.6°F | Ht 68.0 in | Wt 248.0 lb

## 2022-02-02 DIAGNOSIS — R0683 Snoring: Secondary | ICD-10-CM | POA: Diagnosis not present

## 2022-02-02 DIAGNOSIS — G4719 Other hypersomnia: Secondary | ICD-10-CM | POA: Diagnosis not present

## 2022-02-02 NOTE — Progress Notes (Signed)
? ?      ?Stephanie Dunlap    [id number]    1971/03/10 ? ?Primary Care Physician:Blyth, Bryon Lions, MD ? ?Referring Physician: Bradd Canary, MD ?[address] ?[address] ?[place],  [place] [address] ? ?Chief complaint:   ?Patient being seen for concern for obstructive sleep apnea ? ?HPI: ? ?History of snoring, no witnessed apneas, wakes up tired in the mornings ?Daytime sleepiness ? ?Usually goes to bed about 930 to 10 PM ?At least 1 awakening ?Final wake up time about 6:30 AM ? ?Weight is up about 25 pounds ? ?Admits to dryness of the mouth occasionally ?Occasional morning headaches ?Admits to nasal stuffiness for many years, no known allergies ?Memory is okay ? ?No recent multivehicle accident but has had near misses ? ?Dad did snore significantly ? ?Reformed smoker, quit in 2009 ? ?Outpatient Encounter Medications as of 02/02/2022  ?Medication Sig  ? famotidine (PEPCID) 40 MG tablet Take 1 tablet (40 mg total) by mouth daily.  ? ALPRAZolam (XANAX) 0.25 MG tablet Take 1 tablet (0.25 mg total) by mouth 2 (two) times daily as needed for anxiety.  ? aspirin EC 81 MG tablet Take 81 mg by mouth daily.  ? hydrocortisone 2.5 % cream Apply topically as needed.  ? [DISCONTINUED] ketoconazole (NIZORAL) 2 % shampoo Apply 1 application topically 2 (two) times a week. (Patient not taking: Reported on 02/02/2022)  ? ?No facility-administered encounter medications on file as of 02/02/2022.  ? ? ?Allergies as of 02/02/2022  ? (No Known Allergies)  ? ? ?Past Medical History:  ?Diagnosis Date  ? Chicken pox   ? GERD (gastroesophageal reflux disease)   ? History of chicken pox   ? Hyperlipidemia   ? no meds   ? Obesity   ? PTSD (post-traumatic stress disorder)   ? daughter had cardiac arrest from covid  ? ? ?Past Surgical History:  ?Procedure Laterality Date  ? ABDOMINAL HYSTERECTOMY    ? ovaries left in place  ? CERVIX LESION DESTRUCTION    ? GYNECOLOGIC CRYOSURGERY    ? KNEE SURGERY Right   ? PARTIAL HYSTERECTOMY    ? ? ?Family  History  ?Problem Relation Age of Onset  ? Prostate cancer Paternal Grandfather   ? Hypertension Paternal Grandfather   ? Cancer Paternal Grandfather   ?     prostate  ? Diabetes Maternal Grandmother   ? Hypertension Maternal Grandmother   ? Diabetes Mother   ? Hyperlipidemia Mother   ? Hypertension Father   ? Atrial fibrillation Brother   ? Hypertension Brother   ? Sleep apnea Brother   ? Stroke Daughter   ? Heart disease Daughter   ?     cardiac arrest during COVID with defib  ? Depression Sister   ?     PTSD  ? Heart disease Son   ?     arrythmia?  ? Heart disease Maternal Grandfather   ?     MI  ? Hypertension Paternal Grandmother   ? Thyroid disease Paternal Grandmother   ? Breast cancer Neg Hx   ? Colon cancer Neg Hx   ? Esophageal cancer Neg Hx   ? Stomach cancer Neg Hx   ? Rectal cancer Neg Hx   ? ? ?Social History  ? ?Socioeconomic History  ? Marital status: Married  ?  Spouse name: Not on file  ? Number of children: 4  ? Years of education: Not on file  ? Highest education level:  Not on file  ?Occupational History  ? Occupation: nurse  ?Tobacco Use  ? Smoking status: Former  ?  Packs/day: 0.25  ?  Years: 2.00  ?  Pack years: 0.50  ?  Types: Cigarettes  ?  Quit date: 08/13/2015  ?  Years since quitting: 6.4  ? Smokeless tobacco: Never  ?Vaping Use  ? Vaping Use: Never used  ?Substance and Sexual Activity  ? Alcohol use: Yes  ?  Alcohol/week: 0.0 standard drinks  ?  Comment: occ glass of wine  ? Drug use: No  ? Sexual activity: Not on file  ?Other Topics Concern  ? Not on file  ?Social History Narrative  ? 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 ?Social Determinants of Health  ? ?Financial Resource Strain: Not on file  ?Food Insecurity: Not on file  ?Transportation Needs: Not on file  ?Physical Activity: Not on file  ?Stress: Not on file  ?Social Connections: Not on file   ?Intimate Partner Violence: Not on file  ? ? ?Review of Systems  ?Constitutional:  Positive for fatigue.  ?Psychiatric/Behavioral:  Positive for sleep disturbance.   ? ?There were no vitals filed for this visit. ? ? ?Physical Exam ?Constitutional:   ?   Appearance: She is obese.  ?HENT:  ?   Head: Normocephalic.  ?   Mouth/Throat:  ?   Mouth: Mucous membranes are moist.  ?   Comments: Macroglossia, Mallampati 3, crowded oropharynx ?Eyes:  ?   Pupils: Pupils are equal, round, and reactive to light.  ?Cardiovascular:  ?   Rate and Rhythm: Normal rate and regular rhythm.  ?   Heart sounds: No murmur heard. ?  No friction rub.  ?Pulmonary:  ?   Effort: No respiratory distress.  ?   Breath sounds: No stridor. No wheezing or rhonchi.  ?Musculoskeletal:  ?   Cervical back: No rigidity or tenderness.  ?Neurological:  ?   Mental Status: She is alert.  ?Psychiatric:     ?   Mood and Affect: Mood normal.  ? ?Results of the Epworth flowsheet 02/02/2022  ?Sitting and reading 3  ?Watching TV 3  ?Sitting, inactive in a public place (e.g. a theatre or a meeting) 0  ?As a passenger in a car for an hour without a break 3  ?Lying down to rest in the afternoon when circumstances permit 3  ?Sitting and talking to someone 0  ?Sitting quietly after a lunch without alcohol 0  ?In a car, while stopped for a few minutes in traffic 3  ?Total score 15  ? ?Data Reviewed: ?No previous study available ? ?Normal echocardiogram 09/07/2019 ? ?Assessment:  ?Moderate policy of significant obstructive sleep apnea ?We will schedule patient for home sleep study ? ?Snoring ? ?Excessive daytime sleepiness ? ?Obesity ? ?Pathophysiology of sleep disordered breathing discussed with the patient ?Treatment options for sleep disordered breathing discussed with patient including CPAP, an oral device, surgical interventions ? ?Risks of not treating sleep disordered breathing discussed ? ?Plan/Recommendations: ? ?We will schedule patient for home sleep  study ? ?Encourage weight loss efforts ? ?Graded exercise as tolerated ? ?Importance of weight loss as it relates to sleep disordered breathing discussed ? ?Tentative follow-up in 3 to 4 months ? ? ?Virl Diamond MD ?[facility] ?02/02/2022, 1:53 PM ? ?CC: Bradd Canary,  MD ? ? ?

## 2022-02-02 NOTE — Patient Instructions (Signed)
Moderate quality of significant obstructive sleep apnea ? ?Schedule for home sleep study ? ?Weight loss efforts as tolerated ? ?I will see you back in about 3 months ? ?Sleep Apnea ?Sleep apnea affects breathing during sleep. It causes breathing to stop for 10 seconds or more, or to become shallow. People with sleep apnea usually snore loudly. ?It can also increase the risk of: ?Heart attack. ?Stroke. ?Being very overweight (obese). ?Diabetes. ?Heart failure. ?Irregular heartbeat. ?High blood pressure. ?The goal of treatment is to help you breathe normally again. ?What are the causes? ?The most common cause of this condition is a collapsed or blocked airway. ?There are three kinds of sleep apnea: ?Obstructive sleep apnea. This is caused by a blocked or collapsed airway. ?Central sleep apnea. This happens when the brain does not send the right signals to the muscles that control breathing. ?Mixed sleep apnea. This is a combination of obstructive and central sleep apnea. ?What increases the risk? ?Being overweight. ?Smoking. ?Having a small airway. ?Being older. ?Being female. ?Drinking alcohol. ?Taking medicines to calm yourself (sedatives or tranquilizers). ?Having family members with the condition. ?Having a tongue or tonsils that are larger than normal. ?What are the signs or symptoms? ?Trouble staying asleep. ?Loud snoring. ?Headaches in the morning. ?Waking up gasping. ?Dry mouth or sore throat in the morning. ?Being sleepy or tired during the day. ?If you are sleepy or tired during the day, you may also: ?Not be able to focus your mind (concentrate). ?Forget things. ?Get angry a lot and have mood swings. ?Feel sad (depressed). ?Have changes in your personality. ?Have less interest in sex, if you are female. ?Be unable to have an erection, if you are female. ?How is this treated? ? ?Sleeping on your side. ?Using a medicine to get rid of mucus in your nose (decongestant). ?Avoiding the use of alcohol, medicines to  help you relax, or certain pain medicines (narcotics). ?Losing weight, if needed. ?Changing your diet. ?Quitting smoking. ?Using a machine to open your airway while you sleep, such as: ?An oral appliance. This is a mouthpiece that shifts your lower jaw forward. ?A CPAP device. This device blows air through a mask when you breathe out (exhale). ?An EPAP device. This has valves that you put in each nostril. ?A BIPAP device. This device blows air through a mask when you breathe in (inhale) and breathe out. ?Having surgery if other treatments do not work. ?Follow these instructions at home: ?Lifestyle ?Make changes that your doctor recommends. ?Eat a healthy diet. ?Lose weight if needed. ?Avoid alcohol, medicines to help you relax, and some pain medicines. ?Do not smoke or use any products that contain nicotine or tobacco. If you need help quitting, ask your doctor. ?General instructions ?Take over-the-counter and prescription medicines only as told by your doctor. ?If you were given a machine to use while you sleep, use it only as told by your doctor. ?If you are having surgery, make sure to tell your doctor you have sleep apnea. You may need to bring your device with you. ?Keep all follow-up visits. ?Contact a doctor if: ?The machine that you were given to use during sleep bothers you or does not seem to be working. ?You do not get better. ?You get worse. ?Get help right away if: ?Your chest hurts. ?You have trouble breathing in enough air. ?You have an uncomfortable feeling in your back, arms, or stomach. ?You have trouble talking. ?One side of your body feels weak. ?A part of your face  is hanging down. ?These symptoms may be an emergency. Get help right away. Call your local emergency services (911 in the [place] ?Do not wait to see if the symptoms will go away. ?Do not drive yourself to the hospital. ?Summary ?This condition affects breathing during sleep. ?The most common cause is a collapsed or blocked  airway. ?The goal of treatment is to help you breathe normally while you sleep. ?This information is not intended to replace advice given to you by your health care provider. Make sure you discuss any questions you have with your health care provider. ?Document Revised: 06/18/2021 Document Reviewed: 10/18/2020 ?Elsevier Patient Education ? 2022 [employer] Inc[employer] ? ?

## 2022-02-02 NOTE — Addendum Note (Signed)
Addended byArvilla Market on: 02/02/2022 02:27 PM ? ? Modules accepted: Orders ? ?

## 2022-02-12 ENCOUNTER — Ambulatory Visit (INDEPENDENT_AMBULATORY_CARE_PROVIDER_SITE_OTHER): Payer: Managed Care, Other (non HMO) | Admitting: Physician Assistant

## 2022-02-12 ENCOUNTER — Other Ambulatory Visit: Payer: Self-pay

## 2022-02-12 DIAGNOSIS — L509 Urticaria, unspecified: Secondary | ICD-10-CM

## 2022-02-12 MED ORDER — LEVOCETIRIZINE DIHYDROCHLORIDE 5 MG PO TABS: 5.0000 mg | ORAL_TABLET | Freq: Every evening | ORAL | 2 refills | Status: AC

## 2022-02-12 NOTE — Patient Instructions (Addendum)
If flares up please call our office to be seen. Pick up otc Claritin.Marland Kitchen  ?

## 2022-03-05 ENCOUNTER — Encounter: Payer: Self-pay | Admitting: Physician Assistant

## 2022-03-05 NOTE — Progress Notes (Signed)
? ?  New Patient ?  ?Subjective  ?Stephanie Dunlap is a 51 y.o. female who presents for the following: New Patient (Initial Visit) (Patient was having some skin issues on her face and neck and eye brow area. X months. Heat makes it worse. It usually starts like red whelps and it itches and burns. It comes and goes. PCP gave 2.5% hydrocortisone cream. Rash does not come anywhere else. No new skin care products per patient. She used aveeno moisturizer. /No history of eczema. Patient does have family history of dermatitis. ). ? ? ?The following portions of the chart were reviewed this encounter and updated as appropriate:  Tobacco  Allergies  Meds  Problems  Med Hx  Surg Hx  Fam Hx   ?  ? ?Objective  ?Well appearing patient in no apparent distress; mood and affect are within normal limits. ? ?A full examination was performed including scalp, head, eyes, ears, nose, lips, neck, chest, axillae, abdomen, back, buttocks, bilateral upper extremities, bilateral lower extremities, hands, feet, fingers, toes, fingernails, and toenails. All findings within normal limits unless otherwise noted below. ? ?Anterior Mid Neck, Head - Anterior (Face), Left Anterior Neck, Right Anterior Neck ?No obvious whelps. Few erythematous areas scattered.  ? ? ?Assessment & Plan  ?Urticaria ?Head - Anterior (Face); Left Anterior Neck; Right Anterior Neck; Anterior Mid Neck ? ?levocetirizine (XYZAL) 5 MG tablet - Anterior Mid Neck, Head - Anterior (Face), Left Anterior Neck, Right Anterior Neck ?Take 1 tablet (5 mg total) by mouth every evening. ? ? ? ? ?I, Kentley Cedillo, PA-C, have reviewed all documentation's for this visit.  The documentation on 03/05/22 for the exam, diagnosis, procedures and orders are all accurate and complete. ?

## 2022-03-09 IMAGING — MG MM DIGITAL SCREENING BILAT W/ TOMO AND CAD
8 series · 8 of 24 positions shown · non-contrast
Comparison: Previous exam(s).

CLINICAL DATA: Screening.

EXAM:
DIGITAL SCREENING BILATERAL MAMMOGRAM WITH TOMOSYNTHESIS AND CAD
TECHNIQUE: Bilateral screening digital craniocaudal and mediolateral oblique
mammograms were obtained. Bilateral screening digital breast
tomosynthesis was performed. The images were evaluated with
computer-aided detection.

[R MLO synth-2D]
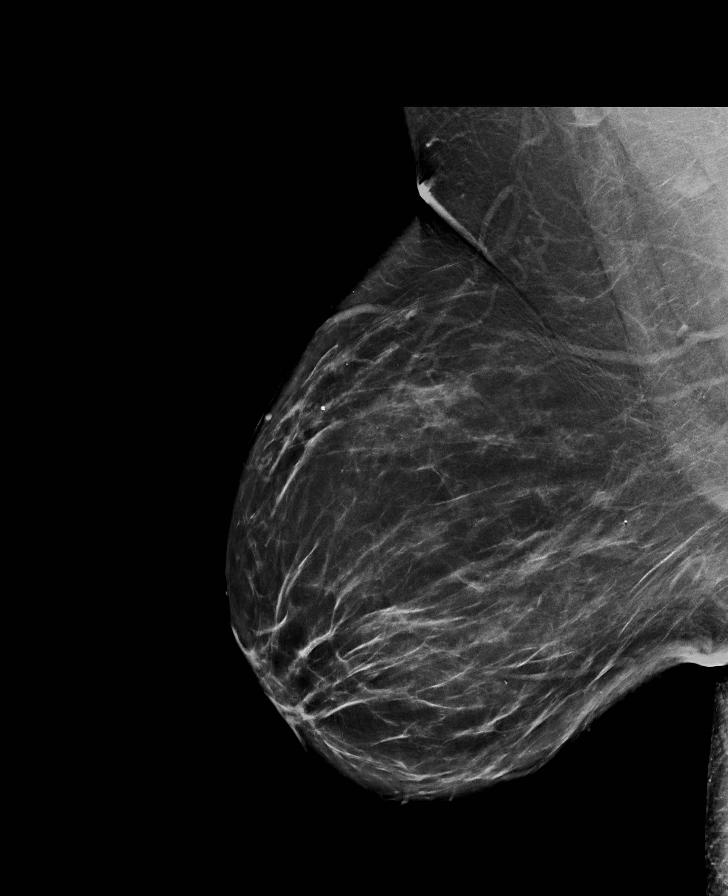

[L MLO synth-2D]
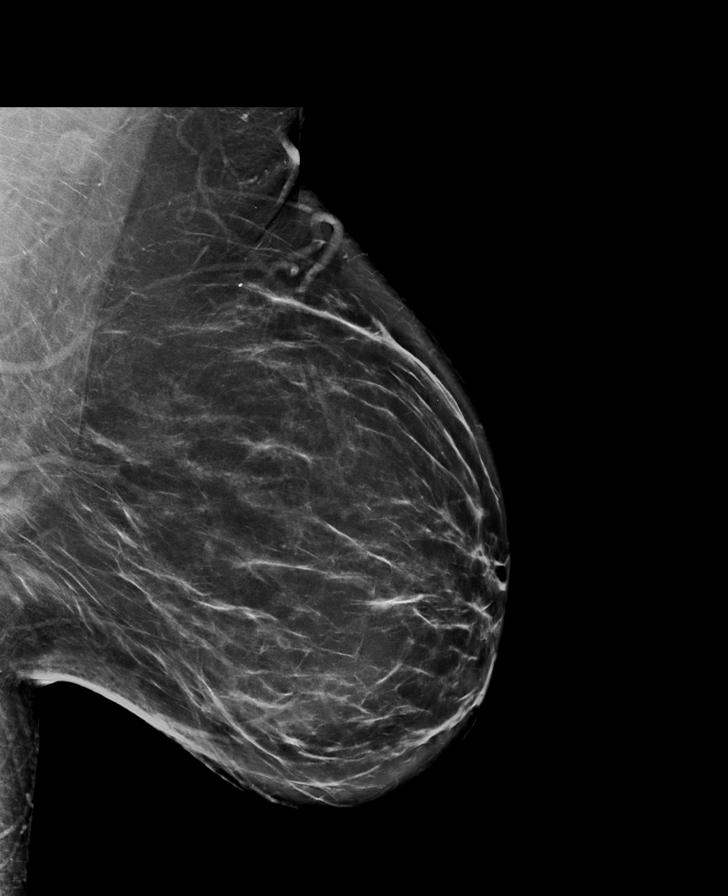

[L CC synth-2D]
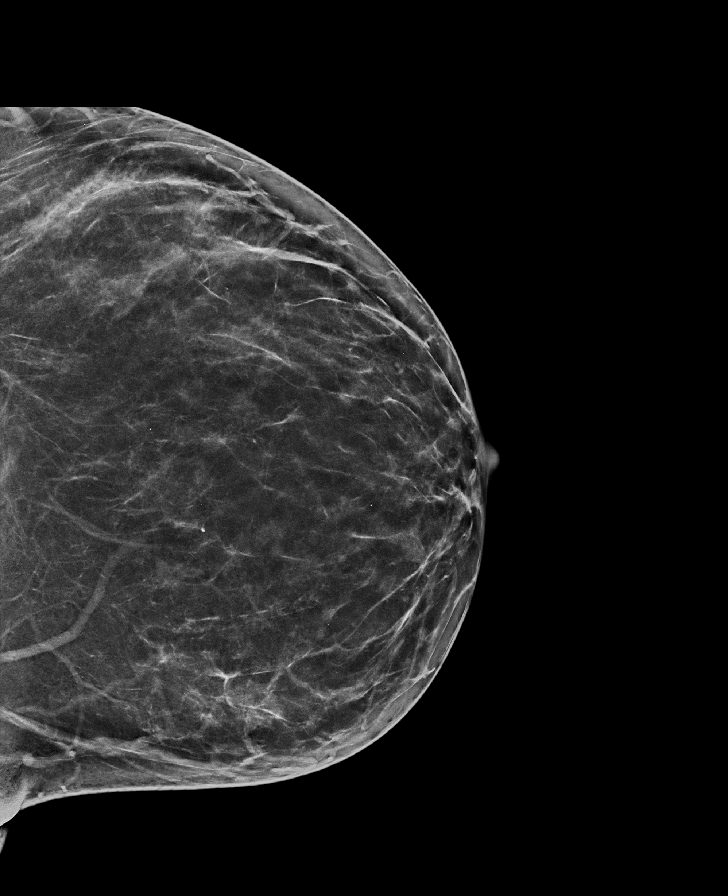

[R CC synth-2D]
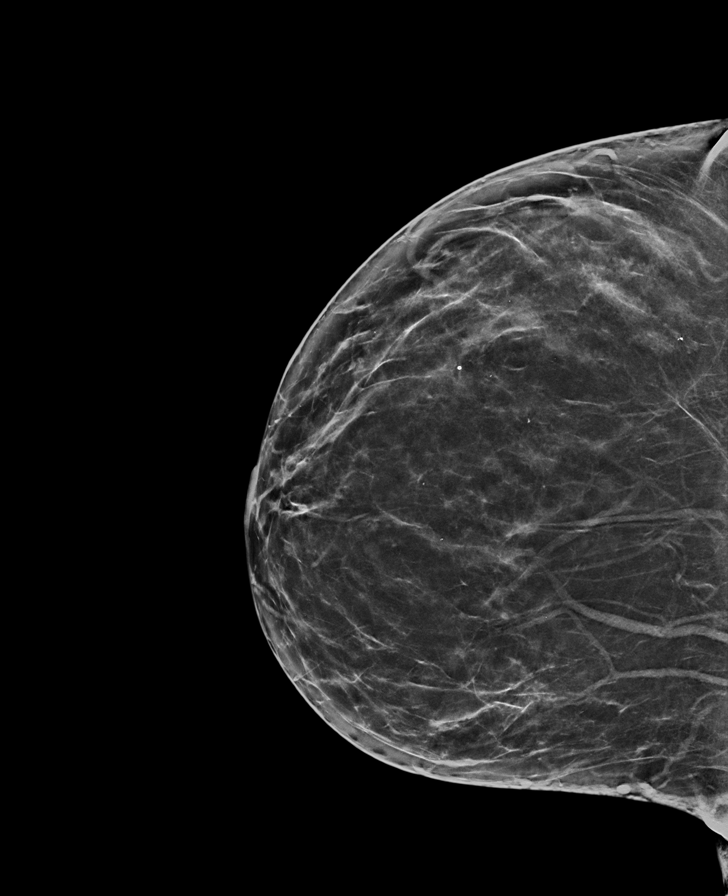

[L CC tomo · tomo slice 39/76.0]
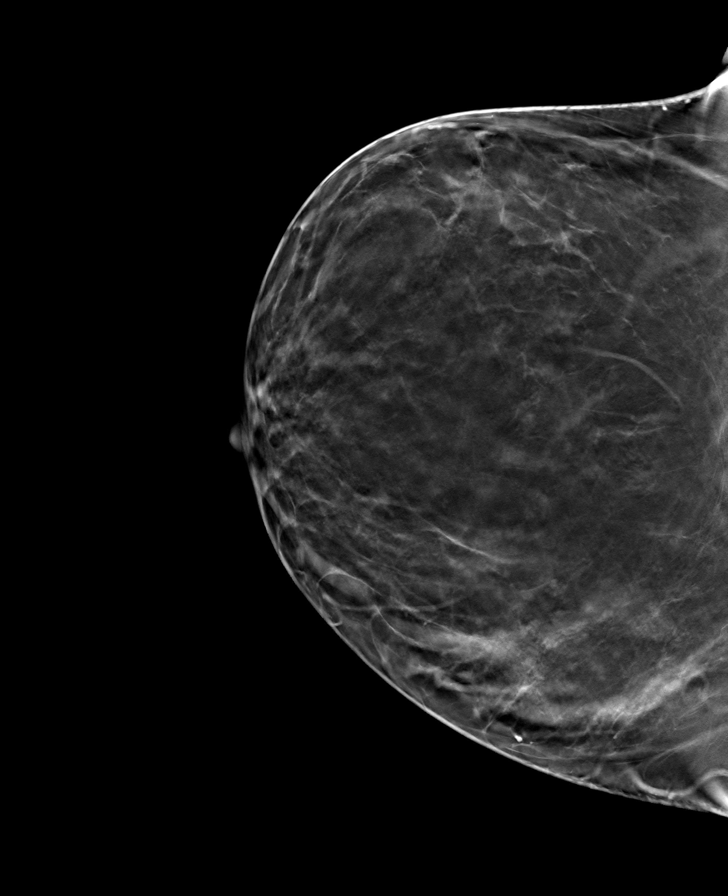

[R CC tomo · tomo slice 37/73.0]
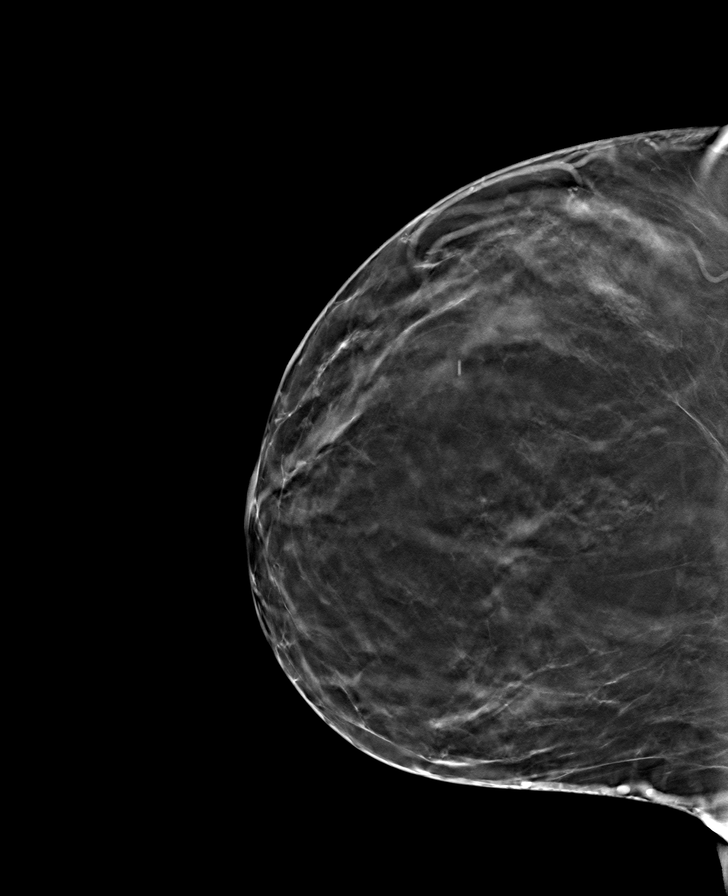

[R MLO tomo · tomo slice 49/96.0]
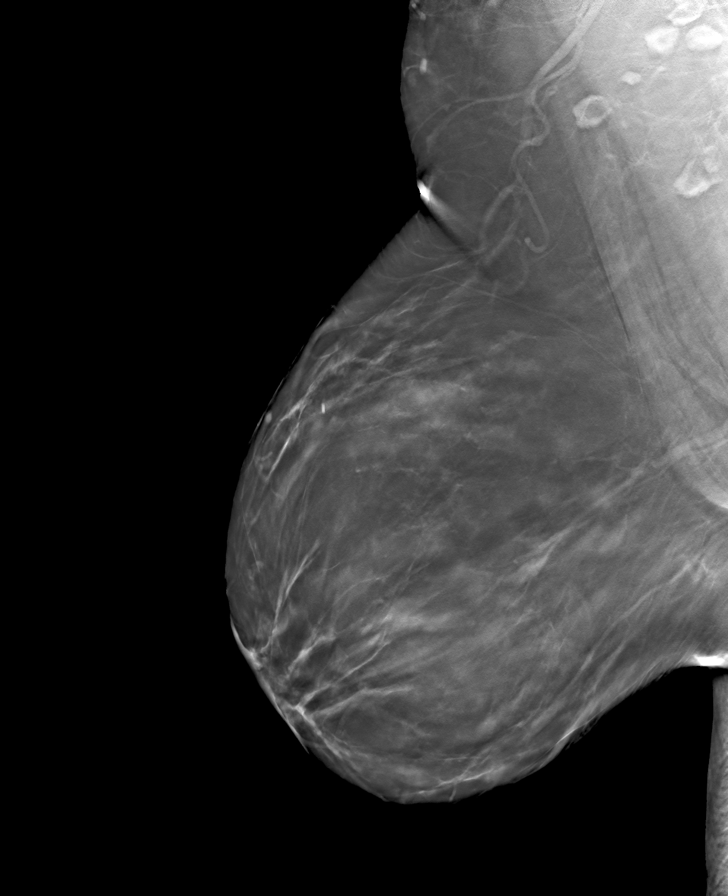

[L MLO tomo · tomo slice 49/98.0]
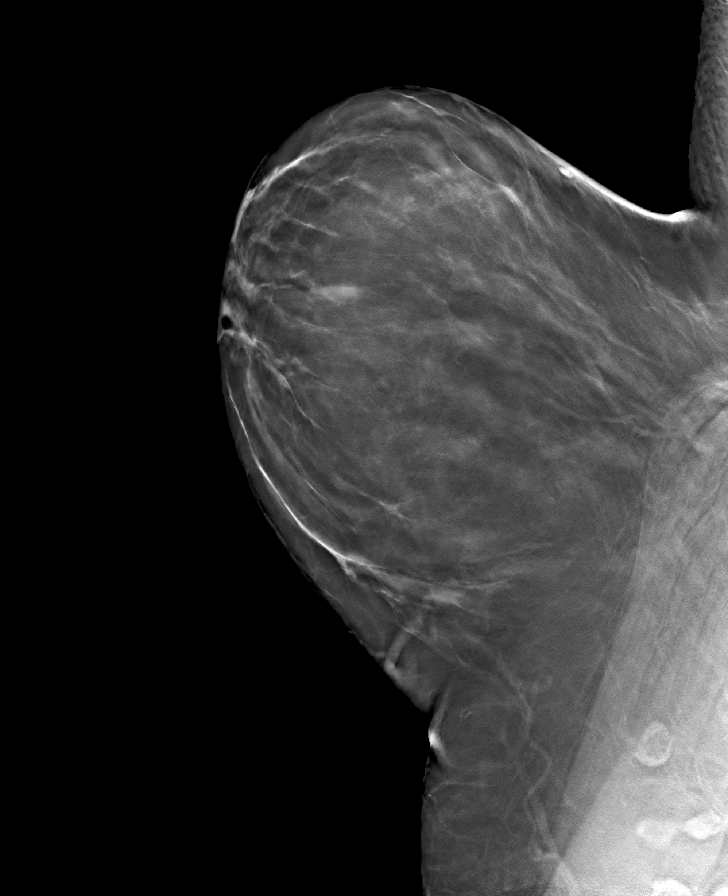

[8 of 24 positions shown; findings below may reference images not displayed]

ACR Breast Density Category b: There are scattered areas of
fibroglandular density.
FINDINGS: There are no findings suspicious for malignancy.
IMPRESSION: No mammographic evidence of malignancy. A result letter of this
screening mammogram will be mailed directly to the patient.

RECOMMENDATION:
Screening mammogram in one year. (Code:51-O-LD2)

BI-RADS CATEGORY  1: Negative.

## 2022-04-14 ENCOUNTER — Ambulatory Visit: Payer: Managed Care, Other (non HMO)

## 2022-04-14 DIAGNOSIS — G4719 Other hypersomnia: Secondary | ICD-10-CM

## 2022-04-14 DIAGNOSIS — R0683 Snoring: Secondary | ICD-10-CM

## 2022-04-14 DIAGNOSIS — G4733 Obstructive sleep apnea (adult) (pediatric): Secondary | ICD-10-CM

## 2022-04-18 ENCOUNTER — Telehealth: Payer: Self-pay | Admitting: Pulmonary Disease

## 2022-04-18 DIAGNOSIS — G4733 Obstructive sleep apnea (adult) (pediatric): Secondary | ICD-10-CM

## 2022-04-18 NOTE — Telephone Encounter (Signed)
Call patient  Sleep study result  Date of study: 04/14/2022  Impression: Mild obstructive sleep apnea Mild oxygen desaturation  Recommendation: Options of treatment for mild obstructive sleep apnea will include  1.  CPAP therapy if there is significant daytime sleepiness or other comorbidities including history of CVA or cardiac disease  -If CPAP is chosen as an option of treatment auto titrating CPAP with a pressure setting of 5-15 will be appropriate  2.  Watchful waiting with emphasis on weight loss measures, sleep position modification to optimize lateral sleep, elevating the head of the bed by about 30 degrees may also help.  3.  An oral device may be fashioned for the treatment of mild sleep disordered breathing, will involve referral to dentist.  Follow-up as previously scheduled

## 2022-04-21 NOTE — Telephone Encounter (Addendum)
I called the patient and gave her the results and she voices understanding. She wants to work on weight loss measures and then call back if she is not improving. Nothing further needed.

## 2022-08-04 ENCOUNTER — Encounter: Payer: Self-pay | Admitting: Family Medicine

## 2022-10-25 NOTE — Assessment & Plan Note (Deleted)
Patient encouraged to maintain heart healthy diet, regular exercise, adequate sleep. Consider daily probiotics. Take medications as prescribed  Labs ordered and reviewed consider covid and flu boosters Colonoscopy 2020 repeat in 2030 Mgm August 2022, repeat this year pap

## 2022-10-25 NOTE — Assessment & Plan Note (Deleted)
Encourage heart healthy diet such as MIND or DASH diet, increase exercise, avoid trans fats, simple carbohydrates and processed foods, consider a krill or fish or flaxseed oil cap daily.  °

## 2022-10-25 NOTE — Assessment & Plan Note (Deleted)
hgba1c acceptable, minimize simple carbs. Increase exercise as tolerated.  

## 2022-10-26 ENCOUNTER — Encounter: Payer: Managed Care, Other (non HMO) | Admitting: Family Medicine

## 2022-10-26 DIAGNOSIS — R7303 Prediabetes: Secondary | ICD-10-CM

## 2022-10-26 DIAGNOSIS — E782 Mixed hyperlipidemia: Secondary | ICD-10-CM

## 2022-10-26 DIAGNOSIS — Z0000 Encounter for general adult medical examination without abnormal findings: Secondary | ICD-10-CM

## 2023-02-16 ENCOUNTER — Ambulatory Visit: Payer: Managed Care, Other (non HMO) | Admitting: Physician Assistant

## 2023-03-01 NOTE — Assessment & Plan Note (Signed)
Encourage heart healthy diet such as MIND or DASH diet, increase exercise, avoid trans fats, simple carbohydrates and processed foods, consider a krill or fish or flaxseed oil cap daily.  °

## 2023-03-01 NOTE — Assessment & Plan Note (Addendum)
Patient encouraged to maintain heart healthy diet, regular exercise, adequate sleep. Consider daily probiotics. Take medications as prescribed. Labs ordered and reviewed. MGM orderedimmunizations reviewed with patient. Colonoscopy 2020 repeat in 2030 Pap 2022

## 2023-03-01 NOTE — Assessment & Plan Note (Signed)
Low risk, repeat in 6 months ?

## 2023-03-01 NOTE — Assessment & Plan Note (Signed)
Encouraged DASH or MIND diet, decrease po intake and increase exercise as tolerated. Needs 7-8 hours of sleep nightly. Avoid trans fats, eat small, frequent meals every 4-5 hours with lean proteins, complex carbs and healthy fats. Minimize simple carbs, high fat foods and processed foods 

## 2023-03-02 ENCOUNTER — Ambulatory Visit (INDEPENDENT_AMBULATORY_CARE_PROVIDER_SITE_OTHER): Payer: Managed Care, Other (non HMO) | Admitting: Family Medicine

## 2023-03-02 ENCOUNTER — Encounter: Payer: Self-pay | Admitting: Family Medicine

## 2023-03-02 VITALS — BP 132/82 | HR 86 | Temp 97.4°F | Resp 16 | Ht 68.0 in | Wt 267.4 lb

## 2023-03-02 DIAGNOSIS — R7303 Prediabetes: Secondary | ICD-10-CM

## 2023-03-02 DIAGNOSIS — Z809 Family history of malignant neoplasm, unspecified: Secondary | ICD-10-CM

## 2023-03-02 DIAGNOSIS — E782 Mixed hyperlipidemia: Secondary | ICD-10-CM | POA: Diagnosis not present

## 2023-03-02 DIAGNOSIS — N83209 Unspecified ovarian cyst, unspecified side: Secondary | ICD-10-CM

## 2023-03-02 DIAGNOSIS — Z0000 Encounter for general adult medical examination without abnormal findings: Secondary | ICD-10-CM | POA: Diagnosis not present

## 2023-03-02 DIAGNOSIS — Z1239 Encounter for other screening for malignant neoplasm of breast: Secondary | ICD-10-CM

## 2023-03-02 MED ORDER — WEGOVY 0.25 MG/0.5ML ~~LOC~~ SOAJ: 0.2500 mg | SUBCUTANEOUS | 0 refills | Status: DC

## 2023-03-02 NOTE — Progress Notes (Signed)
Subjective:   By signing my name below, I, [name], attest that this documentation has been prepared under the direction and in the presence of Bradd Canary, MD., 03/02/2023.   Patient ID: Stephanie Dunlap, female    DOB: [birth year], 51 y.o.   MRN: [id number]  No chief complaint on file.  HPI Patient is in today for a comprehensive physical exam and follow up on chronic medical concerns.  Constipation Patient reports that she occasionally experiences constipation but manages this with Colace.  Family History Patient's 65 yo father has recently been diagnosed with prostate cancer and her 44 yo mother recently suffered from a stroke.  Palpitations Patient reports that she is having infrequent palpitations that do not have a pattern. She denies chest pain or shortness of breath.   Weight Management/Stress Patient states that between taking care of her disabled daughter and working three jobs, she has been stress eating and gained a significant amount of weight. She is interested in partaking in weight loss programs and/or taking injectable weight loss medications. Body mass index is 40.66 kg/m. She denies CP/SOB/HA/fever/chills/ GI or GU symptoms. Wt Readings from Last 3 Encounters:  03/02/23 267 lb 6.4 oz (121.3 kg)  02/02/22 248 lb (112.5 kg)  01/20/22 244 lb (110.7 kg)   Past Medical History:  Diagnosis Date   Chicken pox    GERD (gastroesophageal reflux disease)    History of chicken pox    Hyperlipidemia    no meds    Obesity    PTSD (post-traumatic stress disorder)    daughter had cardiac arrest from covid   Past Surgical History:  Procedure Laterality Date   ABDOMINAL HYSTERECTOMY     ovaries left in place   CERVIX LESION DESTRUCTION     GYNECOLOGIC CRYOSURGERY     KNEE SURGERY Right    PARTIAL HYSTERECTOMY     Family History  Problem Relation Age of Onset   Prostate cancer Paternal Grandfather    Hypertension Paternal Grandfather    Cancer Paternal  Grandfather        prostate   Diabetes Maternal Grandmother    Hypertension Maternal Grandmother    Diabetes Mother    Hyperlipidemia Mother    Hypertension Father    Atrial fibrillation Brother    Hypertension Brother    Sleep apnea Brother    Stroke Daughter    Heart disease Daughter        cardiac arrest during COVID with defib   Depression Sister        PTSD   Heart disease Son        arrythmia?   Heart disease Maternal Grandfather        MI   Hypertension Paternal Grandmother    Thyroid disease Paternal Grandmother    Breast cancer Neg Hx    Colon cancer Neg Hx    Esophageal cancer Neg Hx    Stomach cancer Neg Hx    Rectal cancer Neg Hx     Social History   Socioeconomic History   Marital status: Married    Spouse name: Not on file   Number of children: 4   Years of education: Not on file   Highest education level: Not on file  Occupational History   Occupation: nurse  Tobacco Use   Smoking status: Former    Packs/day: 0.25    Years: 2.00    Additional pack years: 0.00    Total pack years: 0.50    Types: Cigarettes  Quit date: 08/13/2015    Years since quitting: 7.5   Smokeless tobacco: Never  Vaping Use   Vaping Use: Never used  Substance and Sexual Activity   Alcohol use: Yes    Alcohol/week: 0.0 standard drinks of alcohol    Comment: occ glass of wine   Drug use: No   Sexual activity: Not on file  Other Topics Concern   Not on file  Social History Narrative   Lives with husband, works as a Engineer, 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 20 tablet 0   aspirin EC 81 MG tablet Take 81 mg by mouth daily.     famotidine (PEPCID) 40 MG tablet Take 1 tablet (40 mg total) by mouth daily. 30 tablet 5   hydrocortisone 2.5 % cream Apply topically as needed.     levocetirizine (XYZAL) 5 MG tablet Take 1 tablet (5 mg total) by mouth every evening. 90 tablet 2   No facility-administered medications prior to visit.    No Known Allergies  Review of Systems  Constitutional:  Negative for chills and fever.  Respiratory:  Negative for shortness of breath.   Cardiovascular:  Positive for palpitations. Negative for chest pain.  Gastrointestinal:  Negative for abdominal pain, blood in stool, constipation, diarrhea, nausea and vomiting.  Genitourinary:  Negative for dysuria, frequency, hematuria and urgency.  Skin:           Neurological:  Negative for headaches.       Objective:    Physical Exam Constitutional:      General: She is not in acute distress.    Appearance: Normal appearance. She is obese. She is not ill-appearing.  HENT:     Head: Normocephalic and atraumatic.     Right Ear: Tympanic membrane, ear canal and external ear normal.     Left Ear: Tympanic membrane, ear canal and external ear normal.     Nose: Nose normal.     Mouth/Throat:     Mouth: Mucous membranes are moist.     Pharynx: Oropharynx is clear.  Eyes:     General:        Right eye: No discharge.        Left eye: No discharge.     Extraocular Movements: Extraocular movements intact.     Right eye: No nystagmus.     Left eye: No nystagmus.     Pupils: Pupils are equal, round, and reactive to light.  Neck:     Vascular: No carotid bruit.  Cardiovascular:     Rate and Rhythm: Normal rate and regular rhythm.     Pulses: Normal pulses.     Heart sounds: Normal heart sounds. No murmur heard.    No  gallop.  Pulmonary:     Effort: Pulmonary effort is normal. No respiratory distress.     Breath sounds: Normal breath sounds. No wheezing or rales.  Abdominal:     General: Bowel sounds are normal.     Palpations: Abdomen is soft.     Tenderness: There is no abdominal tenderness. There is no guarding.  Musculoskeletal:        General: Normal range of motion.     Cervical back: Normal range of motion.     Right lower leg: No edema.     Left lower leg: No edema.     Comments: Muscle strength 5/5 on upper and lower extremities.   Lymphadenopathy:     Cervical: No cervical adenopathy.  Skin:    General: Skin is warm and dry.  Neurological:     Mental Status: She is alert and oriented to person, place, and time.     Sensory: Sensation is intact.     Motor: Motor function is intact.     Coordination: Coordination is intact.     Deep Tendon Reflexes:     Reflex Scores:      Patellar reflexes are 2+ on the right side and 2+ on the left side. Psychiatric:        Mood and Affect: Mood normal.        Behavior: Behavior normal.        Judgment: Judgment normal.     There were no vitals taken for this visit. Wt Readings from Last 3 Encounters:  02/02/22 248 lb (112.5 kg)  01/20/22 244 lb (110.7 kg)  07/17/21 247 lb 6.4 oz (112.2 kg)    Diabetic Foot Exam - Simple   No data filed    Lab Results  Component Value Date   WBC 6.2 01/20/2022   HGB 12.7 01/20/2022   HCT 38.4 01/20/2022   PLT 337.0 01/20/2022   GLUCOSE 90 01/20/2022   CHOL 220 (H) 01/20/2022   TRIG 184.0 (H) 01/20/2022   HDL 39.70 01/20/2022   LDLDIRECT 154.0 07/17/2021   LDLCALC 144 (H) 01/20/2022   ALT 17 01/20/2022   AST 18 01/20/2022   NA 139 01/20/2022   K 4.6 01/20/2022   CL 102 01/20/2022   CREATININE 0.82 01/20/2022   BUN 10 01/20/2022   CO2 30 01/20/2022   TSH 1.72 01/20/2022   HGBA1C 6.2 01/20/2022    Lab Results  Component Value Date   TSH 1.72 01/20/2022   Lab Results  Component Value  Date   WBC 6.2 01/20/2022   HGB 12.7 01/20/2022   HCT 38.4 01/20/2022   MCV 89.8 01/20/2022   PLT 337.0 01/20/2022   Lab Results  Component Value Date   NA 139 01/20/2022   K 4.6 01/20/2022   CO2 30 01/20/2022   GLUCOSE 90 01/20/2022   BUN 10 01/20/2022   CREATININE 0.82 01/20/2022   BILITOT 0.5 01/20/2022   ALKPHOS 62 01/20/2022   AST 18 01/20/2022   ALT 17 01/20/2022   PROT 7.1 01/20/2022   ALBUMIN 4.3 01/20/2022   CALCIUM 9.8 01/20/2022   ANIONGAP 7 04/21/2013   GFR 84.62 01/20/2022   Lab Results  Component Value Date   CHOL 220 (H) 01/20/2022   Lab Results  Component Value Date   HDL 39.70 01/20/2022   Lab Results  Component Value Date   LDLCALC 144 (H) 01/20/2022   Lab Results  Component Value Date   TRIG 184.0 (H) 01/20/2022   Lab Results  Component Value Date   CHOLHDL 6 01/20/2022   Lab Results  Component Value Date   HGBA1C 6.2 01/20/2022      Assessment & Plan:  Colonoscopy: Last completed on 06/06/2019. Impression:  - Medium sized external and small internal hemorrhoids.  - The examination was otherwise normal on direct and retroflexion views.  - No specimens collected. Recommended repeat colonoscopy in 10 years.  Mammogram: Last completed on 07/21/2021 with no mammographic evidence of malignancy. Repeat mammography ordered.  Pap Smear: Patient has had a total hysterectomy so pap smear does not need to be completed. Ambulatory referral placed to [facility] for ovarian cyst and well-woman care.  Advanced Directives: Encouraged completion of advanced care planning documents.  Healthy Lifestyle: Encouraged 6-8 hours of sleep, heart healthy diet, 60-80 oz of non-alcohol/non-caffeinated fluids, and minimum of 4000 steps daily.  Immunizations: Encouraged patient to consider annual COVID-19 and Flu immunizations as well as Shingles vaccination once turning 52 yo.  Labs: Routine blood work ordered today.  Weight Management: Referral  placed to [facility] Well[facility] program. Prescribed Wegovy 0.25 mg/0.5 mL: inject 0.25 mg into the skin once a week. Problem List Items Addressed This Visit       Other   Morbid obesity   Preventative health care   Mixed hyperlipidemia - Primary   Prediabetes   No orders of the defined types were placed in this encounter.  I, [name], personally preformed the services described in this documentation.  All medical record entries made by the scribe were at my direction and in my presence.  I have reviewed the chart and discharge instructions (if applicable) and agree that the record reflects my personal performance and is accurate and complete. 03/02/2023  I,Mohammed Iqbal,acting as a scribe for Danise Edge, MD.,have documented all relevant documentation on the behalf of Danise Edge, MD,as directed by  Danise Edge, MD while in the presence of Danise Edge, MD.  [name]

## 2023-03-02 NOTE — Patient Instructions (Signed)
Preventive Care 50-50 Years Old, Female Preventive care refers to lifestyle choices and visits with your health care provider that can promote health and wellness. Preventive care visits are also called wellness exams. What can I expect for my preventive care visit? Counseling Your health care provider may ask you questions about your: Medical history, including: Past medical problems. Family medical history. Pregnancy history. Current health, including: Menstrual cycle. Method of birth control. Emotional well-being. Home life and relationship well-being. Sexual activity and sexual health. Lifestyle, including: Alcohol, nicotine or tobacco, and drug use. Access to firearms. Diet, exercise, and sleep habits. Work and work environment. Sunscreen use. Safety issues such as seatbelt and bike helmet use. Physical exam Your health care provider will check your: Height and weight. These may be used to calculate your BMI (body mass index). BMI is a measurement that tells if you are at a healthy weight. Waist circumference. This measures the distance around your waistline. This measurement also tells if you are at a healthy weight and may help predict your risk of certain diseases, such as type 2 diabetes and high blood pressure. Heart rate and blood pressure. Body temperature. Skin for abnormal spots. What immunizations do I need?  Vaccines are usually given at various ages, according to a schedule. Your health care provider will recommend vaccines for you based on your age, medical history, and lifestyle or other factors, such as travel or where you work. What tests do I need? Screening Your health care provider may recommend screening tests for certain conditions. This may include: Lipid and cholesterol levels. Diabetes screening. This is done by checking your blood sugar (glucose) after you have not eaten for a while (fasting). Pelvic exam and Pap test. Hepatitis B test. Hepatitis C  test. HIV (human immunodeficiency virus) test. STI (sexually transmitted infection) testing, if you are at risk. Lung cancer screening. Colorectal cancer screening. Mammogram. Talk with your health care provider about when you should start having regular mammograms. This may depend on whether you have a family history of breast cancer. BRCA-related cancer screening. This may be done if you have a family history of breast, ovarian, tubal, or peritoneal cancers. Bone density scan. This is done to screen for osteoporosis. Talk with your health care provider about your test results, treatment options, and if necessary, the need for more tests. Follow these instructions at home: Eating and drinking  Eat a diet that includes fresh fruits and vegetables, whole grains, lean protein, and low-fat dairy products. Take vitamin and mineral supplements as recommended by your health care provider. Do not drink alcohol if: Your health care provider tells you not to drink. You are pregnant, may be pregnant, or are planning to become pregnant. If you drink alcohol: Limit how much you have to 0-1 drink a day. Know how much alcohol is in your drink. In the [place] one drink equals one 12 oz bottle of beer (355 mL), one 5 oz glass of wine (148 mL), or one 1 oz glass of hard liquor (44 mL). Lifestyle Brush your teeth every morning and night with fluoride toothpaste. Floss one time each day. Exercise for at least 30 minutes 5 or more days each week. Do not use any products that contain nicotine or tobacco. These products include cigarettes, chewing tobacco, and vaping devices, such as e-cigarettes. If you need help quitting, ask your health care provider. Do not use drugs. If you are sexually active, practice safe sex. Use a condom or other form of protection to   prevent STIs. If you do not wish to become pregnant, use a form of birth control. If you plan to become pregnant, see your health care provider for a  prepregnancy visit. Take aspirin only as told by your health care provider. Make sure that you understand how much to take and what form to take. Work with your health care provider to find out whether it is safe and beneficial for you to take aspirin daily. Find healthy ways to manage stress, such as: Meditation, yoga, or listening to music. Journaling. Talking to a trusted person. Spending time with friends and family. Minimize exposure to UV radiation to reduce your risk of skin cancer. Safety Always wear your seat belt while driving or riding in a vehicle. Do not drive: If you have been drinking alcohol. Do not ride with someone who has been drinking. When you are tired or distracted. While texting. If you have been using any mind-altering substances or drugs. Wear a helmet and other protective equipment during sports activities. If you have firearms in your house, make sure you follow all gun safety procedures. Seek help if you have been physically or sexually abused. What's next? Visit your health care provider once a year for an annual wellness visit. Ask your health care provider how often you should have your eyes and teeth checked. Stay up to date on all vaccines. This information is not intended to replace advice given to you by your health care provider. Make sure you discuss any questions you have with your health care provider. Document Revised: 05/07/2021 Document Reviewed: 05/07/2021 Elsevier Patient Education  2023 Elsevier Inc.  

## 2023-03-03 DIAGNOSIS — N83209 Unspecified ovarian cyst, unspecified side: Secondary | ICD-10-CM | POA: Insufficient documentation

## 2023-03-03 DIAGNOSIS — Z809 Family history of malignant neoplasm, unspecified: Secondary | ICD-10-CM | POA: Insufficient documentation

## 2023-03-03 LAB — COMPREHENSIVE METABOLIC PANEL
ALT: 16 U/L (ref 0–35)
AST: 18 U/L (ref 0–37)
Albumin: 4.3 g/dL (ref 3.5–5.2)
Alkaline Phosphatase: 78 U/L (ref 39–117)
BUN: 11 mg/dL (ref 6–23)
CO2: 29 mEq/L (ref 19–32)
Calcium: 9.4 mg/dL (ref 8.4–10.5)
Chloride: 102 mEq/L (ref 96–112)
Creatinine, Ser: 0.79 mg/dL (ref 0.40–1.20)
GFR: 87.8 mL/min (ref >60.00)
Glucose, Bld: 98 mg/dL (ref 70–99)
Potassium: 4.1 mEq/L (ref 3.5–5.1)
Sodium: 139 mEq/L (ref 135–145)
Total Bilirubin: 0.4 mg/dL (ref 0.2–1.2)
Total Protein: 7.1 g/dL (ref 6.0–8.3)

## 2023-03-03 LAB — CBC WITH DIFFERENTIAL/PLATELET
Basophils Absolute: 0 10*3/uL (ref 0.0–0.1)
Basophils Relative: 0.5 % (ref 0.0–3.0)
Eosinophils Absolute: 0.2 10*3/uL (ref 0.0–0.7)
Eosinophils Relative: 2.6 % (ref 0.0–5.0)
HCT: 39.7 % (ref 36.0–46.0)
Hemoglobin: 13.4 g/dL (ref 12.0–15.0)
Lymphocytes Relative: 31.7 % (ref 12.0–46.0)
Lymphs Abs: 2.8 10*3/uL (ref 0.7–4.0)
MCHC: 33.9 g/dL (ref 30.0–36.0)
MCV: 89.6 fl (ref 78.0–100.0)
Monocytes Absolute: 0.8 10*3/uL (ref 0.1–1.0)
Monocytes Relative: 8.5 % (ref 3.0–12.0)
Neutro Abs: 5.1 10*3/uL (ref 1.4–7.7)
Neutrophils Relative %: 56.7 % (ref 43.0–77.0)
Platelets: 398 10*3/uL (ref 150.0–400.0)
RBC: 4.43 Mil/uL (ref 3.87–5.11)
RDW: 14.3 % (ref 11.5–15.5)
WBC: 9 10*3/uL (ref 4.0–10.5)

## 2023-03-03 LAB — LIPID PANEL
Cholesterol: 236 mg/dL — ABNORMAL HIGH (ref 0–200)
HDL: 38.9 mg/dL — ABNORMAL LOW (ref >39.00)
NonHDL: 197.45
Total CHOL/HDL Ratio: 6
Triglycerides: 364 mg/dL — ABNORMAL HIGH (ref 0.0–149.0)
VLDL: 72.8 mg/dL — ABNORMAL HIGH (ref 0.0–40.0)

## 2023-03-03 LAB — LDL CHOLESTEROL, DIRECT: Direct LDL: 156 mg/dL

## 2023-03-03 LAB — TSH: TSH: 1.33 u[IU]/mL (ref 0.35–5.50)

## 2023-03-03 LAB — HEMOGLOBIN A1C: Hgb A1c MFr Bld: 6.4 % (ref 4.6–6.5)

## 2023-03-03 NOTE — Assessment & Plan Note (Signed)
Father with prostate cancer and patient interested in referral for genetics evaluation this is placed

## 2023-03-03 NOTE — Assessment & Plan Note (Signed)
Referred to ob for well woman care and eval

## 2023-03-05 ENCOUNTER — Encounter: Payer: Self-pay | Admitting: Family Medicine

## 2023-03-05 ENCOUNTER — Telehealth: Payer: Self-pay | Admitting: Family Medicine

## 2023-03-05 NOTE — Telephone Encounter (Signed)
Patient called to check if a Vit D check can be added to her 04/09 labs since sometimes they hold the blood work for a while. Please advice.

## 2023-03-08 NOTE — Telephone Encounter (Signed)
Sent pt mychart message

## 2023-03-12 ENCOUNTER — Ambulatory Visit (HOSPITAL_COMMUNITY)
Admission: RE | Admit: 2023-03-12 | Discharge: 2023-03-12 | Disposition: A | Discharge status: Home / Self Care | Payer: Managed Care, Other (non HMO) | Source: Ambulatory Visit | Attending: Family Medicine | Admitting: Family Medicine

## 2023-03-12 DIAGNOSIS — Z1239 Encounter for other screening for malignant neoplasm of breast: Secondary | ICD-10-CM | POA: Diagnosis present

## 2023-03-12 DIAGNOSIS — Z1231 Encounter for screening mammogram for malignant neoplasm of breast: Secondary | ICD-10-CM | POA: Diagnosis not present

## 2023-03-16 MED ORDER — ATORVASTATIN CALCIUM 10 MG PO TABS: 10.0000 mg | ORAL_TABLET | Freq: Every day | ORAL | 2 refills | Status: AC

## 2023-04-21 ENCOUNTER — Inpatient Hospital Stay: Payer: Managed Care, Other (non HMO) | Attending: Genetic Counselor | Admitting: Genetic Counselor

## 2023-04-21 ENCOUNTER — Encounter: Payer: Self-pay | Admitting: Genetic Counselor

## 2023-04-21 ENCOUNTER — Inpatient Hospital Stay: Payer: Managed Care, Other (non HMO)

## 2023-04-21 ENCOUNTER — Other Ambulatory Visit: Payer: Self-pay | Admitting: Genetic Counselor

## 2023-04-21 DIAGNOSIS — Z8042 Family history of malignant neoplasm of prostate: Secondary | ICD-10-CM

## 2023-04-21 DIAGNOSIS — Z803 Family history of malignant neoplasm of breast: Secondary | ICD-10-CM

## 2023-04-21 LAB — GENETIC SCREENING ORDER

## 2023-04-21 NOTE — Progress Notes (Addendum)
REFERRING PROVIDER: Bradd Canary, MD [address] [place],  [place] [address]  PRIMARY PROVIDER:  Bradd Canary, MD  PRIMARY REASON FOR VISIT:  1. Family history of prostate cancer      HISTORY OF PRESENT ILLNESS:   Stephanie Dunlap, a 52 y.o. female, was seen for a [facility] cancer genetics consultation at the request of Dr. Abner Greenspan due to a family history of prostate cancer.  Stephanie Dunlap presents to clinic today to discuss the possibility of a hereditary predisposition to cancer, genetic testing, and to further clarify her future cancer risks, as well as potential cancer risks for family members.   Stephanie Dunlap is a 51 y.o. female with no personal history of cancer.    CANCER HISTORY:  Oncology History   No history exists.     RISK FACTORS:  Menarche was at age 50.  First live birth at age 54.  OCP use for approximately 1 years.  Ovaries intact: yes.  Hysterectomy: yes.  Menopausal status: perimenopausal.  HRT use: 0 years. Colonoscopy: yes; normal. Mammogram within the last year: yes. Number of breast biopsies: 0. Up to date with pelvic exams: yes. Any excessive radiation exposure in the past: no  Past Medical History:  Diagnosis Date   Chicken pox    Family history of prostate cancer    GERD (gastroesophageal reflux disease)    History of chicken pox    Hyperlipidemia    no meds    Obesity    PTSD (post-traumatic stress disorder)    daughter had cardiac arrest from covid    Past Surgical History:  Procedure Laterality Date   ABDOMINAL HYSTERECTOMY     ovaries left in place   CERVIX LESION DESTRUCTION     GYNECOLOGIC CRYOSURGERY     KNEE SURGERY Right    PARTIAL HYSTERECTOMY      Social History   Socioeconomic History   Marital status: Married    Spouse name: Not on file   Number of children: 4   Years of education: Not on file   Highest education level: Not on file  Occupational History   Occupation: nurse  Tobacco Use    Smoking status: Former    Packs/day: 0.25    Years: 2.00    Additional pack years: 0.00    Total pack years: 0.50    Types: Cigarettes    Quit date: 08/13/2015    Years since quitting: 7.6   Smokeless tobacco: Never  Vaping Use   Vaping Use: Never used  Substance and Sexual Activity   Alcohol use: Yes    Alcohol/week: 0.0 standard drinks of alcohol    Comment: occ glass of wine   Drug use: No   Sexual activity: Not on file  Other Topics Concern   Not on file  Social History Narrative   Lives with husband, works as a Engineer, 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  Significant diagnoses are listed below: Family History  Problem Relation Age of Onset   Stroke Mother    Diabetes  Mother    Hyperlipidemia Mother    Prostate cancer Father 40   Hypertension Father    Depression Sister        PTSD   Atrial fibrillation Brother    Hypertension Brother    Sleep apnea Brother    Heart Problems Maternal Aunt        d.35   Diabetes Maternal Grandmother    Hypertension Maternal Grandmother    Heart disease Maternal Grandfather        d. 53 from MI   Hypertension Paternal Grandmother    Thyroid disease Paternal Grandmother    Prostate cancer Paternal Grandfather        d.74 from metastatic prostate ca.   Stroke Daughter    Heart disease Daughter        cardiac arrest during COVID with defib   Heart disease Son        arrythmia?   Heart Problems Cousin        pacemaker, 16y.o.   Heart attack Cousin 40   Prostate cancer Other    Breast cancer Neg Hx    Colon cancer Neg Hx     Esophageal cancer Neg Hx    Stomach cancer Neg Hx    Rectal cancer Neg Hx      Stephanie Dunlap is unaware of previous family history of genetic testing for hereditary cancer risks. Patient's maternal ancestors are of [place] American descent, and paternal ancestors are of [place] descent. There is no reported Ashkenazi Jewish ancestry. There is no known consanguinity.  GENETIC COUNSELING ASSESSMENT: Stephanie Dunlap is a 52 y.o. female with a family history of prostate cancer which is somewhat suggestive of a hereditary cancer syndrome and predisposition to cancer given the number of men in the family with prostate cancer. We, therefore, discussed and recommended the following at today's visit.   DISCUSSION: We discussed that, in general, most cancer is not inherited in families, but instead is sporadic or familial. Sporadic cancers occur by chance and typically happen at older ages (>50 years) as this type of cancer is caused by genetic changes acquired during an individual's lifetime. Some families have more cancers than would be expected by chance; however, the ages or types of cancer are not consistent with a known genetic mutation or known genetic mutations have been ruled out. This type of familial cancer is thought to be due to a combination of multiple genetic, environmental, hormonal, and lifestyle factors. While this combination of factors likely increases the risk of cancer, the exact source of this risk is not currently identifiable or testable.  We discussed that up to 15% of prostate cancer is hereditary, with most cases associated with BRCA mutations.  There are other genes that can be associated with hereditary prostate cancer syndromes.  These include HOXB13, ATM, CHEK2 and PALB2.  We discussed that testing is beneficial for several reasons including knowing how to follow individuals after completing their treatment, identifying whether potential treatment options such as PARP inhibitors would be  beneficial, and understand if other family members could be at risk for cancer and allow them to undergo genetic testing.   We reviewed the characteristics, features and inheritance patterns of hereditary cancer syndromes. We also discussed genetic testing, including the appropriate family members to test, the process of testing, insurance coverage and turn-around-time for results. We discussed the implications of a negative, positive, carrier and/or variant of uncertain significant result. Stephanie Dunlap  was offered a common hereditary cancer panel (47 genes)  and an expanded pan-cancer panel (77 genes). Stephanie Dunlap was informed of the benefits and limitations of each panel, including that expanded pan-cancer panels contain genes that do not have clear management guidelines at this point in time.  We also discussed that as the number of genes included on a panel increases, the chances of variants of uncertain significance increases. Stephanie Dunlap decided to pursue genetic testing for the CancerNext-Expanded+RNAinsight gene panel.   The CancerNext-Expanded gene panel offered by [employer] and includes sequencing and rearrangement analysis for the following 71 genes: AIP, ALK, APC, ATM, BAP1, BARD1, BMPR1A, BRCA1, BRCA2, BRIP1, CDC73, CDH1, CDK4, CDKN1B, CDKN2A, CHEK2, DICER1, FH, FLCN, KIF1B, LZTR1, MAX, MEN1, MET, MLH1, MSH2, MSH6, MUTYH, NF1, NF2, NTHL1, PALB2, PHOX2B, PMS2, POT1, PRKAR1A, PTCH1, PTEN, RAD51C, RAD51D, RB1, RET, SDHA, SDHAF2, SDHB, SDHC, SDHD, SMAD4, SMARCA4, SMARCB1, SMARCE1, STK11, SUFU, TMEM127, TP53, TSC1, TSC2 and VHL (sequencing and deletion/duplication); AXIN2, CTNNA1, EGFR, EGLN1, HOXB13, KIT, MITF, MSH3, PDGFRA, POLD1 and POLE (sequencing only); EPCAM and GREM1 (deletion/duplication only). RNA data is routinely analyzed for use in variant interpretation for all genes.   Based on Stephanie Dunlap's family history of cancer, she meets medical criteria for genetic testing. Despite that  she meets criteria, she may still have an out of pocket cost. We discussed that if her out of pocket cost for testing is over $100, the laboratory will call and confirm whether she wants to proceed with testing.  If the out of pocket cost of testing is less than $100 she will be billed by the genetic testing laboratory.   We discussed that some people do not want to undergo genetic testing due to fear of genetic discrimination.  The Genetic Information Nondiscrimination Act (GINA) was signed into federal law in 2008. GINA prohibits health insurers and most employers from discriminating against individuals based on genetic information (including the results of genetic tests and family history information). According to GINA, health insurance companies cannot consider genetic information to be a preexisting condition, nor can they use it to make decisions regarding coverage or rates. GINA also makes it illegal for most employers to use genetic information in making decisions about hiring, firing, promotion, or terms of employment. It is important to note that GINA does not offer protections for life insurance, disability insurance, or long-term care insurance. GINA does not apply to those in the [employer], those who work for companies with less than 15 employees, and new life insurance or long-term disability insurance policies.  Health status due to a cancer diagnosis is not protected under GINA. More information about GINA can be found by visiting [email or web address].   PLAN: After considering the risks, benefits, and limitations, Stephanie Dunlap provided informed consent to pursue genetic testing and the blood sample was sent to [facility] for analysis of the CancerNext-Expanded+RNAinsight. Results should be available within approximately 2-3 weeks' time, at which point they will be disclosed by telephone to Stephanie Dunlap, as will any additional recommendations warranted by these results. Stephanie Dunlap will  receive a summary of her genetic counseling visit and a copy of her results once available. This information will also be available in Epic.   Lastly, we encouraged Stephanie Dunlap to remain in contact with cancer genetics annually so that we can continuously update the family history and inform her of any changes in cancer genetics and testing that may be of benefit for this family.   Stephanie Dunlap questions were answered to her satisfaction today. Our contact information was provided should  additional questions or concerns arise. Thank you for the referral and allowing [place] to share in the care of your patient.   Kimberly Coye P. Lowell Guitar, MS, [facility] Licensed, Patent attorney [email or web address] phone: [phone]  The patient was seen for a total of 30 minutes in face-to-face genetic counseling.  The patient was seen alone.  Drs. Meliton Rattan, and/or [place] were available for questions, if needed..    _______________________________________________________________________ For Office Staff:  Number of people involved in session: 1 Was an Intern/ student involved with case: no

## 2023-05-03 ENCOUNTER — Telehealth: Payer: Self-pay | Admitting: Genetic Counselor

## 2023-05-03 NOTE — Telephone Encounter (Signed)
Contacted Pt to disclose results of genetic testing. Disclosed negative genetic testing results. Recommended that her father consider genetic testing, and the pt stated she would talk to him about this. Informed the patient we can see her father here for genetic counseling or he can talk to his PCP. If the pt has additional questions about this, she can reach out to the genetic counselor Clydie Braun.

## 2023-05-06 ENCOUNTER — Ambulatory Visit: Payer: Self-pay | Admitting: Genetic Counselor

## 2023-05-06 DIAGNOSIS — Z1379 Encounter for other screening for genetic and chromosomal anomalies: Secondary | ICD-10-CM

## 2023-05-06 NOTE — Progress Notes (Addendum)
HPI:  Ms. Jeff was previously seen in the [facility] due to a family history of prostate cancer and concerns regarding a hereditary predisposition to cancer. Please refer to our prior cancer genetics clinic note for more information regarding our discussion, assessment and recommendations, at the time. Ms. Houchens recent genetic test results were disclosed to her, as were recommendations warranted by these results. These results and recommendations are discussed in more detail below.  CANCER HISTORY:  Oncology History   No history exists.    FAMILY HISTORY:  We obtained a detailed, 4-generation family history.  Significant diagnoses are listed below: Family History  Problem Relation Age of Onset   Stroke Mother    Diabetes Mother    Hyperlipidemia Mother    Prostate cancer Father 59   Hypertension Father    Depression Sister        PTSD   Atrial fibrillation Brother    Hypertension Brother    Sleep apnea Brother    Heart Problems Maternal Aunt        d.35   Diabetes Maternal Grandmother    Hypertension Maternal Grandmother    Heart disease Maternal Grandfather        d. 89 from MI   Hypertension Paternal Grandmother    Thyroid disease Paternal Grandmother    Prostate cancer Paternal Grandfather        d.74 from metastatic prostate ca.   Stroke Daughter    Heart disease Daughter        cardiac arrest during COVID with defib   Heart disease Son        arrythmia?   Heart Problems Cousin        pacemaker, 16y.o.   Heart attack Cousin 40   Prostate cancer Other    Breast cancer Neg Hx    Colon cancer Neg Hx    Esophageal cancer Neg Hx    Stomach cancer Neg Hx    Rectal cancer Neg Hx        Ms. Wight is unaware of previous family history of genetic testing for hereditary cancer risks. Patient's maternal ancestors are of [place] American descent, and paternal ancestors are of [place] descent. There is no reported Ashkenazi Jewish ancestry.  There is no known consanguinity  GENETIC TEST RESULTS: Genetic testing reported out on May 01, 2023 through the CancerNext-Expanded+RNAinsight cancer panel found no pathogenic mutations. The CancerNext-Expanded gene panel offered by [employer] and includes sequencing and rearrangement analysis for the following 71 genes: AIP, ALK, APC, ATM, BAP1, BARD1, BMPR1A, BRCA1, BRCA2, BRIP1, CDC73, CDH1, CDK4, CDKN1B, CDKN2A, CHEK2, DICER1, FH, FLCN, KIF1B, LZTR1, MAX, MEN1, MET, MLH1, MSH2, MSH6, MUTYH, NF1, NF2, NTHL1, PALB2, PHOX2B, PMS2, POT1, PRKAR1A, PTCH1, PTEN, RAD51C, RAD51D, RB1, RET, SDHA, SDHAF2, SDHB, SDHC, SDHD, SMAD4, SMARCA4, SMARCB1, SMARCE1, STK11, SUFU, TMEM127, TP53, TSC1, TSC2 and VHL (sequencing and deletion/duplication); AXIN2, CTNNA1, EGFR, EGLN1, HOXB13, KIT, MITF, MSH3, PDGFRA, POLD1 and POLE (sequencing only); EPCAM and GREM1 (deletion/duplication only). RNA data is routinely analyzed for use in variant interpretation for all genes. The test report has been scanned into EPIC and is located under the Molecular Pathology section of the Results Review tab.  A portion of the result report is included below for reference.     We discussed with Ms. Piedra that because current genetic testing is not perfect, it is possible there may be a gene mutation in one of these genes that current testing cannot detect, but that chance is small.  We also discussed, that there could be another gene that has not yet been discovered, or that we have not yet tested, that is responsible for the cancer diagnoses in the family. It is also possible there is a hereditary cause for the cancer in the family that Ms. Schommer did not inherit and therefore was not identified in her testing.  Therefore, it is important to remain in touch with cancer genetics in the future so that we can continue to offer Ms. Woodell the most up to date genetic testing.   ADDITIONAL GENETIC TESTING: We discussed with Ms. Giuffrida that  her genetic testing was fairly extensive.  If there are genes identified to increase cancer risk that can be analyzed in the future, we would be happy to discuss and coordinate this testing at that time.    CANCER SCREENING RECOMMENDATIONS: Ms. Berendsen test result is considered negative (normal).  This means that we have not identified a hereditary cause for her family history of prostate cancer at this time. Most cancers happen by chance and this negative test suggests that her cancer may fall into this category.    Possible reasons for Ms. Meidinger's negative genetic test include:  1. There may be a gene mutation in one of these genes that current testing methods cannot detect but that chance is small.  2. There could be another gene that has not yet been discovered, or that we have not yet tested, that is responsible for the cancer diagnoses in the family.  3.  There may be no hereditary risk for cancer in the family. The cancers in Ms. Kathol and/or her family may be sporadic/familial or due to other genetic and environmental factors. 4. It is also possible there is a hereditary cause for the cancer in the family that Ms. Gabler did not inherit.  Therefore, it is recommended she continue to follow the cancer management and screening guidelines provided by her primary healthcare provider. An individual's cancer risk and medical management are not determined by genetic test results alone. Overall cancer risk assessment incorporates additional factors, including personal medical history, family history, and any available genetic information that may result in a personalized plan for cancer prevention and surveillance  RECOMMENDATIONS FOR FAMILY MEMBERS:  Individuals in this family might be at some increased risk of developing cancer, over the general population risk, simply due to the family history of cancer.  We recommended women in this family have a yearly mammogram beginning at age 37, or 61  years younger than the earliest onset of cancer, an annual clinical breast exam, and perform monthly breast self-exams. Women in this family should also have a gynecological exam as recommended by their primary provider. All family members should be referred for colonoscopy starting at age 67.  FOLLOW-UP: Lastly, we discussed with Ms. Kump that cancer genetics is a rapidly advancing field and it is possible that new genetic tests will be appropriate for her and/or her family members in the future. We encouraged her to remain in contact with cancer genetics on an annual basis so we can update her personal and family histories and let her know of advances in cancer genetics that may benefit this family.   Our contact number was provided. Ms. Laur questions were answered to her satisfaction, and she knows she is welcome to call us at anytime with additional questions or concerns.   Maylon Cos, MS, [facility] Licensed, Certified Genetic Counselor [email or web address]

## 2023-05-10 ENCOUNTER — Encounter: Payer: Managed Care, Other (non HMO) | Admitting: Obstetrics and Gynecology

## 2023-05-31 NOTE — Assessment & Plan Note (Signed)
Encouraged DASH or MIND diet, decrease po intake and increase exercise as tolerated. Needs 7-8 hours of sleep nightly. Avoid trans fats, eat small, frequent meals every 4-5 hours with lean proteins, complex carbs and healthy fats. Minimize simple carbs, high fat foods and processed foods 

## 2023-05-31 NOTE — Assessment & Plan Note (Signed)
Tolerating statin, encouraged heart healthy diet, avoid trans fats, minimize simple carbs and saturated fats. Increase exercise as tolerated 

## 2023-05-31 NOTE — Assessment & Plan Note (Signed)
hgba1c acceptable, minimize simple carbs. Increase exercise as tolerated.  

## 2023-05-31 NOTE — Assessment & Plan Note (Signed)
Avoid offending foods, start probiotics. Do not eat large meals in late evening and consider raising head of bed.  

## 2023-05-31 NOTE — Progress Notes (Unsigned)
Subjective:    Patient ID: Stephanie Dunlap, female    DOB: Mar 31, 1971, 52 y.o.   MRN: [id number]  No chief complaint on file.   HPI Discussed the use of AI scribe software for clinical note transcription with the patient, who gave verbal consent to proceed.  History of Present Illness         Patient is a 52 yo female in today for follow up on chronic medical concerns. No recent febrile illness or hospitalizations. Denies CP/palp/SOB/HA/congestion/fevers/GI or GU c/o. Taking meds as prescribed    Past Medical History:  Diagnosis Date   Chicken pox    Family history of prostate cancer    GERD (gastroesophageal reflux disease)    History of chicken pox    Hyperlipidemia    no meds    Obesity    PTSD (post-traumatic stress disorder)    daughter had cardiac arrest from covid    Past Surgical History:  Procedure Laterality Date   ABDOMINAL HYSTERECTOMY     ovaries left in place   CERVIX LESION DESTRUCTION     GYNECOLOGIC CRYOSURGERY     KNEE SURGERY Right    PARTIAL HYSTERECTOMY      Family History  Problem Relation Age of Onset   Stroke Mother    Diabetes Mother    Hyperlipidemia Mother    Prostate cancer Father 6   Hypertension Father    Depression Sister        PTSD   Atrial fibrillation Brother    Hypertension Brother    Sleep apnea Brother    Heart Problems Maternal Aunt        d.35   Diabetes Maternal Grandmother    Hypertension Maternal Grandmother    Heart disease Maternal Grandfather        d. 46 from MI   Hypertension Paternal Grandmother    Thyroid disease Paternal Grandmother    Prostate cancer Paternal Grandfather        d.74 from metastatic prostate ca.   Stroke Daughter    Heart disease Daughter        cardiac arrest during COVID with defib   Heart disease Son        arrythmia?   Heart Problems Cousin        pacemaker, 16y.o.   Heart attack Cousin 40   Prostate cancer Other    Breast cancer Neg Hx    Colon cancer Neg Hx     Esophageal cancer Neg Hx    Stomach cancer Neg Hx    Rectal cancer Neg Hx     Social History   Socioeconomic History   Marital status: Married    Spouse name: Not on file   Number of children: 4   Years of education: Not on file   Highest education level: Not on file  Occupational History   Occupation: nurse  Tobacco Use   Smoking status: Former    Packs/day: 0.25    Years: 2.00    Additional pack years: 0.00    Total pack years: 0.50    Types: Cigarettes    Quit date: 08/13/2015    Years since quitting: 7.8   Smokeless tobacco: Never  Vaping Use   Vaping Use: Never used  Substance and Sexual Activity   Alcohol use: Yes    Alcohol/week: 0.0 standard drinks of alcohol    Comment: occ glass of wine   Drug use: No   Sexual activity: Not on file  Other Topics Concern  Not on file  Social History Narrative   Lives with husband, works as a Engineer, 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     atorvastatin (LIPITOR) 10 MG tablet Take 1 tablet (10 mg total) by mouth daily. 90 tablet 2   famotidine (PEPCID) 40 MG tablet Take 1 tablet (40 mg total) by mouth daily. 30 tablet 5   hydrocortisone 2.5 % cream Apply topically as needed.     levocetirizine (XYZAL) 5 MG tablet Take 1 tablet (5 mg total) by mouth every evening. 90 tablet 2   Semaglutide-Weight Management (WEGOVY) 0.25 MG/0.5ML SOAJ Inject 0.25 mg into the skin once a week. 2 mL 0   No facility-administered medications prior to visit.     No Known Allergies  Review of Systems  Constitutional:  Negative for fever and malaise/fatigue.  HENT:  Negative for congestion.   Eyes:  Negative for blurred vision.  Respiratory:  Negative for shortness of breath.   Cardiovascular:  Negative for chest pain, palpitations and leg swelling.  Gastrointestinal:  Negative for abdominal pain, blood in stool and nausea.  Genitourinary:  Negative for dysuria and frequency.  Musculoskeletal:  Negative for falls.  Skin:  Negative for rash.  Neurological:  Negative for dizziness, loss of consciousness and headaches.  Endo/Heme/Allergies:  Negative for environmental allergies.  Psychiatric/Behavioral:  Negative for depression. The patient is not nervous/anxious.        Objective:    Physical Exam Constitutional:      General: She is not in acute distress.    Appearance: Normal appearance. She is well-developed. She is not toxic-appearing.  HENT:     Head: Normocephalic and atraumatic.     Right Ear: External ear normal.     Left Ear: External ear normal.     Nose: Nose normal.  Eyes:     General:        Right eye: No discharge.        Left eye: No discharge.     Conjunctiva/sclera: Conjunctivae normal.  Neck:     Thyroid: No thyromegaly.  Cardiovascular:     Rate and Rhythm: Normal rate and regular rhythm.     Heart sounds: Normal heart sounds. No murmur heard. Pulmonary:     Effort: Pulmonary effort is normal. No respiratory distress.     Breath sounds: Normal breath sounds.  Abdominal:     General: Bowel sounds are normal.     Palpations: Abdomen is soft.     Tenderness: There is no abdominal tenderness. There is no guarding.  Musculoskeletal:        General: Normal range of motion.     Cervical back: Neck supple.  Lymphadenopathy:     Cervical: No cervical adenopathy.  Skin:    General: Skin is warm and dry.  Neurological:     Mental Status: She is alert and oriented to person, place, and time.  Psychiatric:  Mood and Affect: Mood normal.        Behavior: Behavior normal.        Thought Content: Thought content normal.        Judgment: Judgment normal.     There were no vitals taken for this visit. Wt Readings from Last 3 Encounters:  03/02/23 267 lb 6.4 oz (121.3 kg)  02/02/22 248 lb (112.5 kg)  01/20/22 244 lb (110.7 kg)    Diabetic Foot Exam - Simple   No data filed    Lab Results  Component Value Date   WBC 9.0 03/02/2023   HGB 13.4 03/02/2023   HCT 39.7 03/02/2023   PLT 398.0 03/02/2023   GLUCOSE 98 03/02/2023   CHOL 236 (H) 03/02/2023   TRIG 364.0 (H) 03/02/2023   HDL 38.90 (L) 03/02/2023   LDLDIRECT 156.0 03/02/2023   LDLCALC 144 (H) 01/20/2022   ALT 16 03/02/2023   AST 18 03/02/2023   NA 139 03/02/2023   K 4.1 03/02/2023   CL 102 03/02/2023   CREATININE 0.79 03/02/2023   BUN 11 03/02/2023   CO2 29 03/02/2023   TSH 1.33 03/02/2023   HGBA1C 6.4 03/02/2023    Lab Results  Component Value Date   TSH 1.33 03/02/2023   Lab Results  Component Value Date   WBC 9.0 03/02/2023   HGB 13.4 03/02/2023   HCT 39.7 03/02/2023   MCV 89.6 03/02/2023   PLT 398.0 03/02/2023   Lab Results  Component Value Date   NA 139 03/02/2023   K 4.1 03/02/2023   CO2 29 03/02/2023   GLUCOSE 98 03/02/2023   BUN 11 03/02/2023   CREATININE 0.79 03/02/2023   BILITOT 0.4 03/02/2023   ALKPHOS 78 03/02/2023   AST 18 03/02/2023   ALT 16 03/02/2023   PROT 7.1 03/02/2023   ALBUMIN 4.3 03/02/2023   CALCIUM 9.4 03/02/2023   ANIONGAP 7 04/21/2013   GFR 87.80 03/02/2023   Lab Results  Component Value Date   CHOL 236 (H) 03/02/2023   Lab Results  Component Value Date   HDL 38.90 (L) 03/02/2023   Lab Results  Component Value Date   LDLCALC 144 (H) 01/20/2022   Lab Results  Component Value Date   TRIG 364.0 (H) 03/02/2023   Lab Results  Component Value Date   CHOLHDL 6 03/02/2023   Lab Results  Component Value Date   HGBA1C 6.4 03/02/2023       Assessment & Plan:   Morbid obesity (HCC) Assessment & Plan: Encouraged DASH or MIND diet, decrease po intake and increase exercise as tolerated. Needs 7-8 hours of sleep nightly. Avoid trans fats, eat small, frequent meals every 4-5 hours with lean proteins, complex carbs and healthy fats. Minimize simple carbs, high fat foods and processed foods    Mixed hyperlipidemia Assessment & Plan: Tolerating statin, encouraged heart healthy diet, avoid trans fats, minimize simple carbs and saturated fats. Increase exercise as tolerated    Prediabetes Assessment & Plan: hgba1c acceptable, minimize simple carbs. Increase exercise as tolerated.    Gastroesophageal reflux disease, unspecified whether esophagitis present Assessment & Plan: Avoid offending foods, start probiotics. Do not eat large meals in late evening and consider raising head of bed.       Assessment and Plan              Danise Edge, MD

## 2023-06-01 ENCOUNTER — Encounter: Payer: Self-pay | Admitting: Family Medicine

## 2023-06-01 ENCOUNTER — Ambulatory Visit: Payer: Managed Care, Other (non HMO) | Admitting: Family Medicine

## 2023-06-01 DIAGNOSIS — K5900 Constipation, unspecified: Secondary | ICD-10-CM

## 2023-06-01 DIAGNOSIS — R7303 Prediabetes: Secondary | ICD-10-CM | POA: Diagnosis not present

## 2023-06-01 DIAGNOSIS — E559 Vitamin D deficiency, unspecified: Secondary | ICD-10-CM | POA: Diagnosis not present

## 2023-06-01 DIAGNOSIS — K219 Gastro-esophageal reflux disease without esophagitis: Secondary | ICD-10-CM | POA: Diagnosis not present

## 2023-06-01 DIAGNOSIS — E782 Mixed hyperlipidemia: Secondary | ICD-10-CM

## 2023-06-01 LAB — COMPREHENSIVE METABOLIC PANEL
ALT: 16 U/L (ref 0–35)
AST: 17 U/L (ref 0–37)
Albumin: 4.4 g/dL (ref 3.5–5.2)
Alkaline Phosphatase: 71 U/L (ref 39–117)
BUN: 11 mg/dL (ref 6–23)
CO2: 27 mEq/L (ref 19–32)
Calcium: 9.7 mg/dL (ref 8.4–10.5)
Chloride: 103 mEq/L (ref 96–112)
Creatinine, Ser: 0.78 mg/dL (ref 0.40–1.20)
GFR: 89 mL/min (ref >60.00)
Glucose, Bld: 95 mg/dL (ref 70–99)
Potassium: 4.4 mEq/L (ref 3.5–5.1)
Sodium: 139 mEq/L (ref 135–145)
Total Bilirubin: 0.5 mg/dL (ref 0.2–1.2)
Total Protein: 6.9 g/dL (ref 6.0–8.3)

## 2023-06-01 LAB — CBC WITH DIFFERENTIAL/PLATELET
Basophils Absolute: 0 10*3/uL (ref 0.0–0.1)
Basophils Relative: 0.6 % (ref 0.0–3.0)
Eosinophils Absolute: 0.2 10*3/uL (ref 0.0–0.7)
Eosinophils Relative: 2.8 % (ref 0.0–5.0)
HCT: 39.2 % (ref 36.0–46.0)
Hemoglobin: 13 g/dL (ref 12.0–15.0)
Lymphocytes Relative: 32.4 % (ref 12.0–46.0)
Lymphs Abs: 2.7 10*3/uL (ref 0.7–4.0)
MCHC: 33.2 g/dL (ref 30.0–36.0)
MCV: 88.9 fl (ref 78.0–100.0)
Monocytes Absolute: 0.6 10*3/uL (ref 0.1–1.0)
Monocytes Relative: 6.9 % (ref 3.0–12.0)
Neutro Abs: 4.7 10*3/uL (ref 1.4–7.7)
Neutrophils Relative %: 57.3 % (ref 43.0–77.0)
Platelets: 394 10*3/uL (ref 150.0–400.0)
RBC: 4.41 Mil/uL (ref 3.87–5.11)
RDW: 14 % (ref 11.5–15.5)
WBC: 8.2 10*3/uL (ref 4.0–10.5)

## 2023-06-01 LAB — LDL CHOLESTEROL, DIRECT: Direct LDL: 135 mg/dL

## 2023-06-01 LAB — LIPID PANEL
Cholesterol: 205 mg/dL — ABNORMAL HIGH (ref 0–200)
HDL: 35.9 mg/dL — ABNORMAL LOW (ref >39.00)
NonHDL: 169.38
Total CHOL/HDL Ratio: 6
Triglycerides: 231 mg/dL — ABNORMAL HIGH (ref 0.0–149.0)
VLDL: 46.2 mg/dL — ABNORMAL HIGH (ref 0.0–40.0)

## 2023-06-01 LAB — TSH: TSH: 0.98 u[IU]/mL (ref 0.35–5.50)

## 2023-06-01 LAB — VITAMIN D 25 HYDROXY (VIT D DEFICIENCY, FRACTURES): VITD: 26.48 ng/mL — ABNORMAL LOW (ref 30.00–100.00)

## 2023-06-01 LAB — HEMOGLOBIN A1C: Hgb A1c MFr Bld: 6.1 % (ref 4.6–6.5)

## 2023-06-01 MED ORDER — WEGOVY 0.5 MG/0.5ML ~~LOC~~ SOAJ: 0.5000 mg | SUBCUTANEOUS | Status: DC

## 2023-06-01 NOTE — Patient Instructions (Signed)
Encouraged increased hydration and fiber in diet. Daily probiotics. If bowels not moving can use MOM 2 tbls po in 4 oz of warm prune juice by mouth every 2-3 days. If no results then repeat in 4 hours with  Dulcolax suppository pr, may repeat again in 4 more hours as needed. Seek care if symptoms worsen. Consider daily Miralax and/or Dulcolax if symptoms persist.  Benefiber with the Miralax  Constipation, Adult Constipation is when a person has fewer than three bowel movements in a week, has difficulty having a bowel movement, or has stools (feces) that are dry, hard, or larger than normal. Constipation may be caused by an underlying condition. It may become worse with age if a person takes certain medicines and does not take in enough fluids. Follow these instructions at home: Eating and drinking  Eat foods that have a lot of fiber, such as beans, whole grains, and fresh fruits and vegetables. Limit foods that are low in fiber and high in fat and processed sugars, such as fried or sweet foods. These include french fries, hamburgers, cookies, candies, and soda. Drink enough fluid to keep your urine pale yellow. General instructions Exercise regularly or as told by your health care provider. Try to do 150 minutes of moderate exercise each week. Use the bathroom when you have the urge to go. Do not hold it in. Take over-the-counter and prescription medicines only as told by your health care provider. This includes any fiber supplements. During bowel movements: Practice deep breathing while relaxing the lower abdomen. Practice pelvic floor relaxation. Watch your condition for any changes. Let your health care provider know about them. Keep all follow-up visits as told by your health care provider. This is important. Contact a health care provider if: You have pain that gets worse. You have a fever. You do not have a bowel movement after 4 days. You vomit. You are not hungry or you lose  weight. You are bleeding from the opening between the buttocks (anus). You have thin, pencil-like stools. Get help right away if: You have a fever and your symptoms suddenly get worse. You leak stool or have blood in your stool. Your abdomen is bloated. You have severe pain in your abdomen. You feel dizzy or you faint. Summary Constipation is when a person has fewer than three bowel movements in a week, has difficulty having a bowel movement, or has stools (feces) that are dry, hard, or larger than normal. Eat foods that have a lot of fiber, such as beans, whole grains, and fresh fruits and vegetables. Drink enough fluid to keep your urine pale yellow. Take over-the-counter and prescription medicines only as told by your health care provider. This includes any fiber supplements. This information is not intended to replace advice given to you by your health care provider. Make sure you discuss any questions you have with your health care provider. Document Revised: 09/23/2022 Document Reviewed: 09/23/2022 Elsevier Patient Education  2024 [employer].

## 2023-06-24 ENCOUNTER — Encounter (INDEPENDENT_AMBULATORY_CARE_PROVIDER_SITE_OTHER): Payer: Self-pay

## 2023-07-02 ENCOUNTER — Encounter: Payer: Managed Care, Other (non HMO) | Admitting: Obstetrics and Gynecology

## 2023-10-10 NOTE — Assessment & Plan Note (Signed)
Tolerating statin, encouraged heart healthy diet, avoid trans fats, minimize simple carbs and saturated fats. Increase exercise as tolerated 

## 2023-10-10 NOTE — Assessment & Plan Note (Signed)
hgba1c acceptable, minimize simple carbs. Increase exercise as tolerated.  

## 2023-10-10 NOTE — Assessment & Plan Note (Signed)
No recent exacerbation 

## 2023-10-12 ENCOUNTER — Ambulatory Visit (INDEPENDENT_AMBULATORY_CARE_PROVIDER_SITE_OTHER): Payer: Managed Care, Other (non HMO) | Admitting: Family Medicine

## 2023-10-12 ENCOUNTER — Encounter: Payer: Self-pay | Admitting: Family Medicine

## 2023-10-12 VITALS — BP 128/82 | HR 80 | Temp 98.5°F | Resp 18 | Ht 68.0 in | Wt 218.4 lb

## 2023-10-12 DIAGNOSIS — E782 Mixed hyperlipidemia: Secondary | ICD-10-CM | POA: Diagnosis not present

## 2023-10-12 DIAGNOSIS — R7303 Prediabetes: Secondary | ICD-10-CM

## 2023-10-12 DIAGNOSIS — R002 Palpitations: Secondary | ICD-10-CM | POA: Diagnosis not present

## 2023-10-12 DIAGNOSIS — R202 Paresthesia of skin: Secondary | ICD-10-CM | POA: Insufficient documentation

## 2023-10-12 DIAGNOSIS — R7989 Other specified abnormal findings of blood chemistry: Secondary | ICD-10-CM | POA: Diagnosis not present

## 2023-10-12 DIAGNOSIS — E559 Vitamin D deficiency, unspecified: Secondary | ICD-10-CM

## 2023-10-12 DIAGNOSIS — M25561 Pain in right knee: Secondary | ICD-10-CM | POA: Diagnosis not present

## 2023-10-12 DIAGNOSIS — Z23 Encounter for immunization: Secondary | ICD-10-CM

## 2023-10-12 DIAGNOSIS — M79672 Pain in left foot: Secondary | ICD-10-CM

## 2023-10-12 NOTE — Progress Notes (Signed)
Subjective:    Patient ID: Stephanie Dunlap, female    DOB: 1971/06/02, 52 y.o.   MRN: [id number]  Chief Complaint  Patient presents with  . Follow-up    HPI Discussed the use of AI scribe software for clinical note transcription with the patient, who gave verbal consent to proceed.  History of Present Illness   The patient, with a history of three knee surgeries, presents with escalating right knee pain over the past three months. The pain has been inhibiting their ability to exercise and has been associated with occasional locking of the knee. The patient also reports nocturnal pain. There is no history of recent injury, but the patient notes a sensation of 'pushing through fluid' and fullness at the bottom of the knee.  In addition to the knee pain, the patient has been experiencing numbness in the second and third toes of the left foot. This numbness worsens with standing or wearing flat shoes and is associated with a sensation of coldness. The patient reports that these symptoms have been occurring on and off for a while but have been worsening recently.  The patient is currently on semaglutide [facility]) for weight management and reports no significant side effects. They also mention a family history of hip replacement and express concern about a similar trajectory due to the current knee pain.        Past Medical History:  Diagnosis Date  . Chicken pox   . Family history of prostate cancer   . GERD (gastroesophageal reflux disease)   . History of chicken pox   . Hyperlipidemia    no meds   . Obesity   . PTSD (post-traumatic stress disorder)    daughter had cardiac arrest from covid    Past Surgical History:  Procedure Laterality Date  . ABDOMINAL HYSTERECTOMY     ovaries left in place  . CERVIX LESION DESTRUCTION    . GYNECOLOGIC CRYOSURGERY    . KNEE SURGERY Right   . PARTIAL HYSTERECTOMY      Family History  Problem Relation Age of Onset  . Stroke Mother   .  Diabetes Mother   . Hyperlipidemia Mother   . Prostate cancer Father 28  . Hypertension Father   . Depression Sister        PTSD  . Atrial fibrillation Brother   . Hypertension Brother   . Sleep apnea Brother   . Heart Problems Maternal Aunt        d.35  . Diabetes Maternal Grandmother   . Hypertension Maternal Grandmother   . Heart disease Maternal Grandfather        d. 78 from MI  . Hypertension Paternal Grandmother   . Thyroid disease Paternal Grandmother   . Prostate cancer Paternal Grandfather        d.74 from metastatic prostate ca.  . Stroke Daughter   . Heart disease Daughter        cardiac arrest during COVID with defib  . Heart disease Son        arrythmia?  . Heart Problems Cousin        pacemaker, 16y.o.  . Heart attack Cousin 40  . Prostate cancer Other   . Breast cancer Neg Hx   . Colon cancer Neg Hx   . Esophageal cancer Neg Hx   . Stomach cancer Neg Hx   . Rectal cancer Neg Hx     Social History   Socioeconomic History  . Marital status: Married  Spouse name: Not on file  . Number of children: 4  . Years of education: Not on file  . Highest education level: Not on file  Occupational History  . Occupation: nurse  Tobacco Use  . Smoking status: Former    Current packs/day: 0.00    Average packs/day: 0.3 packs/day for 2.0 years (0.5 ttl pk-yrs)    Types: Cigarettes    Start date: 08/12/2013    Quit date: 08/13/2015    Years since quitting: 8.1  . Smokeless tobacco: Never  Vaping Use  . Vaping status: Never Used  Substance and Sexual Activity  . Alcohol use: Yes    Alcohol/week: 0.0 standard drinks of alcohol    Comment: occ glass of wine  . Drug use: No  . Sexual activity: Not on file  Other Topics Concern  . 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 aspirin EC 81 MG tablet Take 81 mg by mouth daily.    Marland Kitchen atorvastatin (LIPITOR) 10 MG tablet Take 1 tablet (10 mg total) by mouth daily. 90 tablet 2  . famotidine (PEPCID) 40 MG tablet Take 1 tablet (40 mg total) by mouth daily. 30 tablet 5  . hydrocortisone 2.5 % cream Apply topically as needed.    Marland Kitchen levocetirizine (XYZAL) 5 MG tablet Take 1 tablet (5 mg total) by mouth every evening. 90 tablet 2  . Semaglutide-Weight Management (WEGOVY) 1.7 MG/0.75ML SOAJ Inject 1.7 mg into the skin.    . Semaglutide-Weight Management (WEGOVY) 0.5 MG/0.5ML SOAJ Inject 0.5 mg into the skin once a week. 2 mL    No facility-administered medications prior to visit.    No Known Allergies  Review of Systems  Constitutional:  Negative for fever and malaise/fatigue.  HENT:  Negative for congestion.   Eyes:  Negative for blurred vision.  Respiratory:  Negative for shortness of breath.   Cardiovascular:  Negative for chest pain, palpitations and leg swelling.  Gastrointestinal:  Negative for abdominal pain, blood in stool and nausea.  Genitourinary:  Negative for dysuria and frequency.  Musculoskeletal:  Positive for joint pain. Negative for falls.  Skin:  Negative for rash.  Neurological:  Negative for dizziness, loss of consciousness and headaches.  Endo/Heme/Allergies:  Negative for environmental allergies.  Psychiatric/Behavioral:  Negative for depression. The patient is not nervous/anxious.        Objective:    Physical Exam Constitutional:      General: She is not in acute distress.    Appearance: Normal appearance. She is well-developed. She is not  toxic-appearing.  HENT:     Head: Normocephalic and atraumatic.     Right Ear: External ear normal.     Left Ear: External ear normal.     Nose: Nose normal.  Eyes:     General:        Right eye: No discharge.        Left eye: No discharge.  Conjunctiva/sclera: Conjunctivae normal.  Neck:     Thyroid: No thyromegaly.  Cardiovascular:     Rate and Rhythm: Normal rate and regular rhythm.     Heart sounds: Normal heart sounds. No murmur heard. Pulmonary:     Effort: Pulmonary effort is normal. No respiratory distress.     Breath sounds: Normal breath sounds.  Abdominal:     General: Bowel sounds are normal.     Palpations: Abdomen is soft.     Tenderness: There is no abdominal tenderness. There is no guarding.  Musculoskeletal:        General: Normal range of motion.     Cervical back: Neck supple.  Lymphadenopathy:     Cervical: No cervical adenopathy.  Skin:    General: Skin is warm and dry.  Neurological:     Mental Status: She is alert and oriented to person, place, and time.  Psychiatric:        Mood and Affect: Mood normal.        Behavior: Behavior normal.        Thought Content: Thought content normal.        Judgment: Judgment normal.    BP 128/82 (BP Location: Left Arm, Patient Position: Sitting, Cuff Size: Large)   Pulse 80   Temp 98.5 F (36.9 C) (Oral)   Resp 18   Ht 5\' 8"  (1.727 m)   Wt 218 lb 6.4 oz (99.1 kg)   SpO2 97%   BMI 33.21 kg/m  Wt Readings from Last 3 Encounters:  10/12/23 218 lb 6.4 oz (99.1 kg)  06/01/23 240 lb 12.8 oz (109.2 kg)  03/02/23 267 lb 6.4 oz (121.3 kg)    Diabetic Foot Exam - Simple   No data filed    Lab Results  Component Value Date   WBC 8.2 06/01/2023   HGB 13.0 06/01/2023   HCT 39.2 06/01/2023   PLT 394.0 06/01/2023   GLUCOSE 95 06/01/2023   CHOL 205 (H) 06/01/2023   TRIG 231.0 (H) 06/01/2023   HDL 35.90 (L) 06/01/2023   LDLDIRECT 135.0 06/01/2023   LDLCALC 144 (H) 01/20/2022   ALT 16 06/01/2023   AST  17 06/01/2023   NA 139 06/01/2023   K 4.4 06/01/2023   CL 103 06/01/2023   CREATININE 0.78 06/01/2023   BUN 11 06/01/2023   CO2 27 06/01/2023   TSH 0.98 06/01/2023   HGBA1C 6.1 06/01/2023    Lab Results  Component Value Date   TSH 0.98 06/01/2023   Lab Results  Component Value Date   WBC 8.2 06/01/2023   HGB 13.0 06/01/2023   HCT 39.2 06/01/2023   MCV 88.9 06/01/2023   PLT 394.0 06/01/2023   Lab Results  Component Value Date   NA 139 06/01/2023   K 4.4 06/01/2023   CO2 27 06/01/2023   GLUCOSE 95 06/01/2023   BUN 11 06/01/2023   CREATININE 0.78 06/01/2023   BILITOT 0.5 06/01/2023   ALKPHOS 71 06/01/2023   AST 17 06/01/2023   ALT 16 06/01/2023   PROT 6.9 06/01/2023   ALBUMIN 4.4 06/01/2023   CALCIUM 9.7 06/01/2023   ANIONGAP 7 04/21/2013   GFR 89.00 06/01/2023   Lab Results  Component Value Date   CHOL 205 (H) 06/01/2023   Lab Results  Component Value Date   HDL 35.90 (L) 06/01/2023   Lab Results  Component Value Date   LDLCALC 144 (H) 01/20/2022   Lab Results  Component Value Date   TRIG 231.0 (H) 06/01/2023  Lab Results  Component Value Date   CHOLHDL 6 06/01/2023   Lab Results  Component Value Date   HGBA1C 6.1 06/01/2023       Assessment & Plan:  Mixed hyperlipidemia Assessment & Plan: Tolerating statin, encouraged heart healthy diet, avoid trans fats, minimize simple carbs and saturated fats. Increase exercise as tolerated   Orders: -     Lipid panel -     TSH  Palpitations Assessment & Plan: No recent exacerbation  Orders: -     CBC with Differential/Platelet -     TSH  Prediabetes Assessment & Plan: hgba1c acceptable, minimize simple carbs. Increase exercise as tolerated.   Orders: -     Comprehensive metabolic panel -     Hemoglobin A1c -     TSH  Right knee pain, unspecified chronicity -     Ambulatory referral to Sports Medicine  Left foot pain -     Ambulatory referral to Sports Medicine  Paresthesia of left  foot  Low vitamin D level -     Vitamin D 1,25 dihydroxy -     VITAMIN D 25 Hydroxy (Vit-D Deficiency, Fractures)  Need for Tdap vaccination -     Tdap vaccine greater than or equal to 7yo IM  Need for shingles vaccine -     Varicella-zoster vaccine IM    Assessment and Plan    Right Knee Pain Chronic knee pain with recent escalation, locking, and nocturnal symptoms. No recent trauma. Possible effusion. No overt signs of inflammation. History of arthroscopic surgeries. -Refer to sports medicine for further evaluation and management.   Left Foot Numbness Numbness in the second and third toes of the left foot, worsening with standing and cold temperatures. Likely nerve impingement. -Include in referral to sports medicine for evaluation.  Vitamin D Deficiency Patient on over-the-counter Vitamin D supplementation. -Check Vitamin D levels today and adjust dose if necessary.  General Health Maintenance / Followup Plans -Administer Shingrix vaccine (first dose) and Tetanus vaccine today. -Check blood work today including glucose, cholesterol, and Vitamin D levels. -Schedule follow-up appointment in 4 months. -Annual check-up 3 months after the 53-month follow-up. -Consider Shingles vaccine for patient, discuss at next visit.         Danise Edge, MD

## 2023-10-13 LAB — LIPID PANEL
Cholesterol: 216 mg/dL — ABNORMAL HIGH (ref 0–200)
HDL: 38.7 mg/dL — ABNORMAL LOW (ref >39.00)
LDL Cholesterol: 142 mg/dL — ABNORMAL HIGH (ref 0–99)
NonHDL: 177.73
Total CHOL/HDL Ratio: 6
Triglycerides: 178 mg/dL — ABNORMAL HIGH (ref 0.0–149.0)
VLDL: 35.6 mg/dL (ref 0.0–40.0)

## 2023-10-13 LAB — CBC WITH DIFFERENTIAL/PLATELET
Basophils Absolute: 0 10*3/uL (ref 0.0–0.1)
Basophils Relative: 0.7 % (ref 0.0–3.0)
Eosinophils Absolute: 0.2 10*3/uL (ref 0.0–0.7)
Eosinophils Relative: 2.3 % (ref 0.0–5.0)
HCT: 40.9 % (ref 36.0–46.0)
Hemoglobin: 13.4 g/dL (ref 12.0–15.0)
Lymphocytes Relative: 33.8 % (ref 12.0–46.0)
Lymphs Abs: 2.2 10*3/uL (ref 0.7–4.0)
MCHC: 32.7 g/dL (ref 30.0–36.0)
MCV: 90.5 fL (ref 78.0–100.0)
Monocytes Absolute: 0.6 10*3/uL (ref 0.1–1.0)
Monocytes Relative: 8.5 % (ref 3.0–12.0)
Neutro Abs: 3.5 10*3/uL (ref 1.4–7.7)
Neutrophils Relative %: 54.7 % (ref 43.0–77.0)
Platelets: 385 10*3/uL (ref 150.0–400.0)
RBC: 4.52 Mil/uL (ref 3.87–5.11)
RDW: 13.9 % (ref 11.5–15.5)
WBC: 6.5 10*3/uL (ref 4.0–10.5)

## 2023-10-13 LAB — COMPREHENSIVE METABOLIC PANEL
ALT: 19 U/L (ref 0–35)
AST: 16 U/L (ref 0–37)
Albumin: 4.4 g/dL (ref 3.5–5.2)
Alkaline Phosphatase: 69 U/L (ref 39–117)
BUN: 12 mg/dL (ref 6–23)
CO2: 30 meq/L (ref 19–32)
Calcium: 9.7 mg/dL (ref 8.4–10.5)
Chloride: 103 meq/L (ref 96–112)
Creatinine, Ser: 0.82 mg/dL (ref 0.40–1.20)
GFR: 83.6 mL/min (ref >60.00)
Glucose, Bld: 62 mg/dL — ABNORMAL LOW (ref 70–99)
Potassium: 3.9 meq/L (ref 3.5–5.1)
Sodium: 142 meq/L (ref 135–145)
Total Bilirubin: 0.4 mg/dL (ref 0.2–1.2)
Total Protein: 6.9 g/dL (ref 6.0–8.3)

## 2023-10-13 LAB — HEMOGLOBIN A1C: Hgb A1c MFr Bld: 6 % (ref 4.6–6.5)

## 2023-10-13 LAB — TSH: TSH: 1.04 u[IU]/mL (ref 0.35–5.50)

## 2023-10-13 LAB — VITAMIN D 25 HYDROXY (VIT D DEFICIENCY, FRACTURES): VITD: 32.9 ng/mL (ref 30.00–100.00)

## 2023-10-14 ENCOUNTER — Ambulatory Visit: Payer: Managed Care, Other (non HMO)

## 2023-10-14 ENCOUNTER — Ambulatory Visit (INDEPENDENT_AMBULATORY_CARE_PROVIDER_SITE_OTHER): Payer: Managed Care, Other (non HMO) | Admitting: Sports Medicine

## 2023-10-14 VITALS — BP 130/82 | HR 109 | Ht 68.0 in | Wt 218.0 lb

## 2023-10-14 DIAGNOSIS — M175 Other unilateral secondary osteoarthritis of knee: Secondary | ICD-10-CM

## 2023-10-14 DIAGNOSIS — G8929 Other chronic pain: Secondary | ICD-10-CM | POA: Diagnosis not present

## 2023-10-14 DIAGNOSIS — M25561 Pain in right knee: Secondary | ICD-10-CM

## 2023-10-14 MED ORDER — MELOXICAM 15 MG PO TABS: 15.0000 mg | ORAL_TABLET | Freq: Every day | ORAL | 0 refills | Status: DC

## 2023-10-14 NOTE — Patient Instructions (Signed)
-   Start meloxicam 15 mg daily x2 weeks.  If still having pain after 2 weeks, complete 3rd-week of meloxicam. May use remaining meloxicam as needed once daily for pain control.  Do not to use additional NSAIDs while taking meloxicam.  May use Tylenol [phone] mg 2 to 3 times a day for breakthrough pain. Knee HEP Activity as tolerated  4 week follow up

## 2023-10-14 NOTE — Progress Notes (Signed)
Stephanie Dunlap D.[facility] [address] [place] [address] Phone: [phone]   Assessment and Plan:     1. Chronic pain of right knee 2. Other secondary osteoarthritis of right knee -Chronic with exacerbation, initial sports medicine visit - Chronic right knee pain worsening over the past 1 month consistent with flare of osteoarthritis.  History of 3 right knee surgeries including meniscectomies and apparent ACL repair based on x-ray imaging - X-ray obtained in clinic.  My interpretation: No acute fracture or dislocation.  Severe medial compartment degenerative osteoarthritis with loss of joint space, cortical sclerosis and spurring.  Hardware in place with tunneling visualized - Start meloxicam 15 mg daily x2 weeks.  If still having pain after 2 weeks, complete 3rd-week of meloxicam. May use remaining meloxicam as needed once daily for pain control.  Do not to use additional NSAIDs while taking meloxicam.  May use Tylenol [phone] mg 2 to 3 times a day for breakthrough pain.  -Start HEP for knee  15 additional minutes spent for educating Therapeutic Home Exercise Program.  This included exercises focusing on stretching, strengthening, with focus on eccentric aspects.   Long term goals include an improvement in range of motion, strength, endurance as well as avoiding reinjury. Patient's frequency would include in 1-2 times a day, 3-5 times a week for a duration of 6-12 weeks. Proper technique shown and discussed handout in great detail with ATC.  All questions were discussed and answered.    Pertinent previous records reviewed include none  Follow Up: 4 weeks for reevaluation.  If no improvement or worsening of symptoms, could consider physical therapy versus intra-articular CSI   Subjective:   I, Stephanie Dunlap, am serving as a Neurosurgeon for Doctor Richardean Sale  Chief Complaint: right knee pain   HPI:   10/14/23 Patient is a 52 year old  female with concerns of right knee pain. Patient states that her knee is locking up. Hx of 3 knee surgeries. Intermittent pain that has turned into constant pain over the past month. A burning sensation. Takes tylenol at night . Decreased ROM feels like a pulling sensation. If she stands for too long pain . Patellar tendon pain. She feels like she has to pull her leg to get unlocked. She states it feels like she is pushing through fluid when she walks   Relevant Historical Information: None pertinent  Additional pertinent review of systems negative.   Current Outpatient Medications:    aspirin EC 81 MG tablet, Take 81 mg by mouth daily., Disp: , Rfl:    atorvastatin (LIPITOR) 10 MG tablet, Take 1 tablet (10 mg total) by mouth daily., Disp: 90 tablet, Rfl: 2   famotidine (PEPCID) 40 MG tablet, Take 1 tablet (40 mg total) by mouth daily., Disp: 30 tablet, Rfl: 5   hydrocortisone 2.5 % cream, Apply topically as needed., Disp: , Rfl:    levocetirizine (XYZAL) 5 MG tablet, Take 1 tablet (5 mg total) by mouth every evening., Disp: 90 tablet, Rfl: 2   meloxicam (MOBIC) 15 MG tablet, Take 1 tablet (15 mg total) by mouth daily., Disp: 30 tablet, Rfl: 0   Semaglutide-Weight Management (WEGOVY) 1.7 MG/0.75ML SOAJ, Inject 1.7 mg into the skin., Disp: , Rfl:    Objective:     Vitals:   10/14/23 1419  BP: 130/82  Pulse: (!) 109  SpO2: 98%  Weight: 218 lb (98.9 kg)  Height: 5\' 8"  (1.727 m)      Body  mass index is 33.15 kg/m.    Physical Exam:    General:  awake, alert oriented, no acute distress nontoxic Skin: no suspicious lesions or rashes Neuro:sensation intact and strength 5/5 with no deficits, no atrophy, normal muscle tone Psych: No signs of anxiety, depression or other mood disorder  Right knee: No swelling No deformity Neg fluid wave, joint milking ROM Flex 105, Ext 5 NTTP over the quad tendon, medial fem condyle, lat fem condyle, patella, plica, patella tendon, tibial tuberostiy,  fibular head, posterior fossa, pes anserine bursa, gerdy's tubercle, medial jt line, lateral jt line Neg anterior and posterior drawer Neg lachman Neg sag sign Negative varus stress Negative valgus stress Positive McMurray for pain, negative for palpable pop Positive Thessaly for anterior knee pain  Gait normal    Electronically signed by:  Stephanie Dunlap D.[facility] 2:43 PM 10/14/23

## 2023-10-16 LAB — VITAMIN D 1,25 DIHYDROXY
Vitamin D 1, 25 (OH)2 Total: 54 pg/mL (ref 18–72)
Vitamin D2 1, 25 (OH)2: <8 pg/mL
Vitamin D3 1, 25 (OH)2: 54 pg/mL

## 2023-11-10 NOTE — Progress Notes (Unsigned)
    Stephanie Dunlap D.[facility] [address] [place] [address] Phone: [phone]   Assessment and Plan:     There are no diagnoses linked to this encounter.  ***   Pertinent previous records reviewed include ***    Follow Up: ***     Subjective:   I, Stephanie Dunlap, am serving as a Neurosurgeon for Doctor Richardean Sale   Chief Complaint: right knee pain    HPI:    10/14/23 Patient is a 52 year old female with concerns of right knee pain. Patient states that her knee is locking up. Hx of 3 knee surgeries. Intermittent pain that has turned into constant pain over the past month. A burning sensation. Takes tylenol at night . Decreased ROM feels like a pulling sensation. If she stands for too long pain . Patellar tendon pain. She feels like she has to pull her leg to get unlocked. She states it feels like she is pushing through fluid when she walks   11/11/2023 Patient states   Relevant Historical Information: None pertinent    Additional pertinent review of systems negative.   Current Outpatient Medications:    aspirin EC 81 MG tablet, Take 81 mg by mouth daily., Disp: , Rfl:    atorvastatin (LIPITOR) 10 MG tablet, Take 1 tablet (10 mg total) by mouth daily., Disp: 90 tablet, Rfl: 2   famotidine (PEPCID) 40 MG tablet, Take 1 tablet (40 mg total) by mouth daily., Disp: 30 tablet, Rfl: 5   hydrocortisone 2.5 % cream, Apply topically as needed., Disp: , Rfl:    levocetirizine (XYZAL) 5 MG tablet, Take 1 tablet (5 mg total) by mouth every evening., Disp: 90 tablet, Rfl: 2   meloxicam (MOBIC) 15 MG tablet, Take 1 tablet (15 mg total) by mouth daily., Disp: 30 tablet, Rfl: 0   Semaglutide-Weight Management (WEGOVY) 1.7 MG/0.75ML SOAJ, Inject 1.7 mg into the skin., Disp: , Rfl:    Objective:     There were no vitals filed for this visit.    There is no height or weight on file to calculate BMI.    Physical Exam:     ***   Electronically signed by:  Stephanie Dunlap D.[facility] 7:51 AM 11/10/23

## 2023-11-11 ENCOUNTER — Ambulatory Visit: Payer: Managed Care, Other (non HMO) | Admitting: Sports Medicine

## 2023-11-11 VITALS — BP 130/80 | HR 59 | Ht 68.0 in | Wt 216.0 lb

## 2023-11-11 DIAGNOSIS — G8929 Other chronic pain: Secondary | ICD-10-CM | POA: Diagnosis not present

## 2023-11-11 DIAGNOSIS — M25561 Pain in right knee: Secondary | ICD-10-CM

## 2023-11-11 DIAGNOSIS — M175 Other unilateral secondary osteoarthritis of knee: Secondary | ICD-10-CM | POA: Diagnosis not present

## 2023-11-11 MED ORDER — MELOXICAM 15 MG PO TABS: 15.0000 mg | ORAL_TABLET | Freq: Every day | ORAL | 0 refills | Status: AC | PRN

## 2023-11-11 NOTE — Patient Instructions (Signed)
Discontinue daily meloxicam limit to 1-2 times per week  Tylenol [phone] mg 2-3 times a day for pain relief  As needed follow up

## 2023-12-08 ENCOUNTER — Encounter: Payer: Self-pay | Admitting: Family Medicine

## 2023-12-09 ENCOUNTER — Other Ambulatory Visit: Payer: Self-pay | Admitting: Family

## 2023-12-09 MED ORDER — FLUCONAZOLE 150 MG PO TABS: 150.0000 mg | ORAL_TABLET | Freq: Once | ORAL | 0 refills | Status: AC

## 2024-02-07 ENCOUNTER — Encounter (INDEPENDENT_AMBULATORY_CARE_PROVIDER_SITE_OTHER): Payer: Self-pay

## 2024-02-27 NOTE — Assessment & Plan Note (Deleted)
 hgba1c acceptable, minimize simple carbs. Increase exercise as tolerated.

## 2024-02-27 NOTE — Assessment & Plan Note (Deleted)
 Patient encouraged to maintain heart healthy diet, regular exercise, adequate sleep. Consider daily probiotics. Take medications as prescribed. Labs ordered and reviewed  Colonoscopy 05/2019 repeat in 2030 Pap MGM 02/2023 Given and reviewed copy of ACP documents from [facility] and encouraged to complete and return

## 2024-02-27 NOTE — Assessment & Plan Note (Deleted)
 Supplement and monitor

## 2024-02-27 NOTE — Assessment & Plan Note (Deleted)
 Tolerating statin, encouraged heart healthy diet, avoid trans fats, minimize simple carbs and saturated fats. Increase exercise as tolerated

## 2024-03-02 ENCOUNTER — Encounter: Payer: Managed Care, Other (non HMO) | Admitting: Family Medicine

## 2024-03-02 DIAGNOSIS — Z0000 Encounter for general adult medical examination without abnormal findings: Secondary | ICD-10-CM

## 2024-03-02 DIAGNOSIS — E782 Mixed hyperlipidemia: Secondary | ICD-10-CM

## 2024-03-02 DIAGNOSIS — R7989 Other specified abnormal findings of blood chemistry: Secondary | ICD-10-CM

## 2024-03-02 DIAGNOSIS — R7303 Prediabetes: Secondary | ICD-10-CM

## 2024-03-09 ENCOUNTER — Encounter: Payer: Self-pay | Admitting: Family Medicine

## 2024-03-09 ENCOUNTER — Ambulatory Visit (INDEPENDENT_AMBULATORY_CARE_PROVIDER_SITE_OTHER): Admitting: Family Medicine

## 2024-03-09 VITALS — BP 121/76 | HR 75 | Ht 68.0 in | Wt 220.0 lb

## 2024-03-09 DIAGNOSIS — Z0000 Encounter for general adult medical examination without abnormal findings: Secondary | ICD-10-CM | POA: Diagnosis not present

## 2024-03-09 DIAGNOSIS — R7989 Other specified abnormal findings of blood chemistry: Secondary | ICD-10-CM | POA: Diagnosis not present

## 2024-03-09 DIAGNOSIS — R7303 Prediabetes: Secondary | ICD-10-CM | POA: Diagnosis not present

## 2024-03-09 DIAGNOSIS — Z23 Encounter for immunization: Secondary | ICD-10-CM

## 2024-03-09 NOTE — Progress Notes (Signed)
 Complete physical exam  Patient: Stephanie Dunlap   DOB: 05/26/1971   53 y.o. Female  MRN: [id number]  Subjective:    Chief Complaint  Patient presents with   Annual Exam    Corissa Oguinn is a 53 y.o. female who presents today for a complete physical exam. She reports consuming a general diet. The patient does not participate in regular exercise at present. She generally feels well. She reports sleeping well. She does not have additional problems to discuss today.   Currently lives with: husband Acute concerns or interim problems since last visit: no  Vision concerns: no Dental concerns: no STD concerns: no  ETOH use: rare Nicotine use: no Recreational drugs/illegal substances: no     Most recent fall risk assessment:    06/01/2023   11:39 AM  Fall Risk   Falls in the past year? 0  Number falls in past yr: 0  Injury with Fall? 1  Follow up Falls evaluation completed     Most recent depression screenings:    06/01/2023   11:39 AM 03/02/2023    2:25 PM  PHQ 2/9 Scores  PHQ - 2 Score 0 0  PHQ- 9 Score 0 0        Patient Care Team: Bradd Canary, MD as PCP - General (Family Medicine) Gynecology, [facility] (Obstetrics and Gynecology) Glyn Ade, PA-C as Physician Assistant (Dermatology)   Outpatient Medications Prior to Visit  Medication Sig   aspirin EC 81 MG tablet Take 81 mg by mouth daily.   atorvastatin (LIPITOR) 10 MG tablet Take 1 tablet (10 mg total) by mouth daily.   famotidine (PEPCID) 40 MG tablet Take 1 tablet (40 mg total) by mouth daily.   hydrocortisone 2.5 % cream Apply topically as needed.   levocetirizine (XYZAL) 5 MG tablet Take 1 tablet (5 mg total) by mouth every evening.   meloxicam (MOBIC) 15 MG tablet Take 1 tablet (15 mg total) by mouth daily as needed for pain.   Semaglutide-Weight Management (WEGOVY) 1.7 MG/0.75ML SOAJ Inject 1.7 mg into the skin. (Patient not taking: Reported on 03/09/2024)   No  facility-administered medications prior to visit.    ROS All review of systems negative except what is listed in the HPI        Objective:     BP 121/76   Pulse 75   Ht 5\' 8"  (1.727 m)   Wt 220 lb (99.8 kg)   SpO2 99%   BMI 33.45 kg/m    Physical Exam Vitals reviewed.  Constitutional:      General: She is not in acute distress.    Appearance: Normal appearance. She is not ill-appearing.  HENT:     Head: Normocephalic and atraumatic.     Right Ear: Tympanic membrane normal.     Left Ear: Tympanic membrane normal.     Nose: Nose normal.     Mouth/Throat:     Mouth: Mucous membranes are moist.     Pharynx: Oropharynx is clear.  Eyes:     Extraocular Movements: Extraocular movements intact.     Conjunctiva/sclera: Conjunctivae normal.     Pupils: Pupils are equal, round, and reactive to light.  Neck:     Vascular: No carotid bruit.  Cardiovascular:     Rate and Rhythm: Normal rate and regular rhythm.     Pulses: Normal pulses.     Heart sounds: Normal heart sounds.  Pulmonary:     Effort: Pulmonary effort is normal.  Breath sounds: Normal breath sounds.  Abdominal:     General: Abdomen is flat. Bowel sounds are normal. There is no distension.     Palpations: Abdomen is soft. There is no mass.     Tenderness: There is no abdominal tenderness. There is no right CVA tenderness, left CVA tenderness, guarding or rebound.  Genitourinary:    Comments: Deferred exam Musculoskeletal:        General: Normal range of motion.     Cervical back: Normal range of motion and neck supple. No tenderness.     Right lower leg: No edema.     Left lower leg: No edema.  Lymphadenopathy:     Cervical: No cervical adenopathy.  Skin:    General: Skin is warm and dry.     Capillary Refill: Capillary refill takes less than 2 seconds.  Neurological:     General: No focal deficit present.     Mental Status: She is alert and oriented to person, place, and time. Mental status is at  baseline.  Psychiatric:        Mood and Affect: Mood normal.        Behavior: Behavior normal.        Thought Content: Thought content normal.        Judgment: Judgment normal.      No results found for any visits on 03/09/24.     Assessment & Plan:    Routine Health Maintenance and Physical Exam Discussed health promotion and safety including diet and exercise recommendations, dental health, and injury prevention. Tobacco cessation if applicable. Seat belts, sunscreen, smoke detectors, etc.    Immunization History  Administered Date(s) Administered   Hepatitis A, Ped/Adol-2 Dose 05/04/2018   Influenza-Unspecified 08/23/2021   MMR 05/04/2018   Moderna Sars-Covid-2 Vaccination 01/27/2020, 02/28/2020, 07/27/2020, 01/07/2022   Tdap 10/12/2023   Zoster Recombinant(Shingrix) 10/12/2023, 03/09/2024    Health Maintenance  Topic Date Due   HIV Screening  Never done   Pneumococcal Vaccine 77-31 Years old (1 of 2 - PCV) 03/08/2025 (Originally 09/09/1992)   COVID-19 Vaccine (5 - 2024-25 season) 03/08/2025 (Originally 07/25/2023)   INFLUENZA VACCINE  06/23/2024   MAMMOGRAM  03/11/2025   Colonoscopy  06/05/2029   DTaP/Tdap/Td (2 - Td or Tdap) 10/11/2033   Hepatitis C Screening  Completed   Zoster Vaccines- Shingrix  Completed   HPV VACCINES  Aged Out   Meningococcal B Vaccine  Aged Out        Problem List Items Addressed This Visit       Active Problems   Prediabetes   Relevant Orders   Hemoglobin A1c   Low vitamin D level   Relevant Orders   VITAMIN D 25 Hydroxy (Vit-D Deficiency, Fractures)   Other Visit Diagnoses       Annual physical exam    -  Primary   Relevant Orders   VITAMIN D 25 Hydroxy (Vit-D Deficiency, Fractures)   Hemoglobin A1c   CBC with Differential/Platelet   Comprehensive metabolic panel with GFR   Lipid panel   TSH     Need for shingles vaccine       Relevant Orders   Zoster Recombinant (Shingrix ) (Completed)       PATIENT COUNSELING:    Sexuality: Discussed sexually transmitted diseases, partner selection, use of condoms, avoidance of unintended pregnancy, and contraceptive alternatives.   I discussed with the patient that most people either abstain from alcohol or drink within safe limits (<=14/week and <=4 drinks/occasion for males, <=  7/weeks and <= 3 drinks/occasion for females) and that the risk for alcohol disorders and other health effects rises proportionally with the number of drinks per week and how often a drinker exceeds daily limits.  Discussed cessation/primary prevention of drug use and availability of treatment for abuse.   Diet: Encouraged to adjust caloric intake to maintain or achieve ideal body weight, to reduce intake of dietary saturated fat and total fat, to limit sodium intake by avoiding high sodium foods and not adding table salt, and to maintain adequate dietary potassium and calcium preferably from fresh fruits, vegetables, and low-fat dairy products. Encouraged vitamin D 1000 units and Calcium 1300mg  or 4 servings of dairy a day.  Emphasized the importance of regular exercise.  Injury prevention: Discussed safety belts, safety helmets, smoke detector, smoking near bedding or upholstery.   Dental health: Discussed importance of regular tooth brushing, flossing, and dental visits.     Return in about 6 months (around 09/08/2024) for routine follow-up.     Everlina Hock, NP

## 2024-03-10 LAB — LIPID PANEL
Cholesterol: 201 mg/dL — ABNORMAL HIGH (ref <200)
HDL: 46 mg/dL — ABNORMAL LOW (ref >=50)
LDL Cholesterol (Calc): 131 mg/dL — ABNORMAL HIGH
Non-HDL Cholesterol (Calc): 155 mg/dL — ABNORMAL HIGH (ref <130)
Total CHOL/HDL Ratio: 4.4 (calc) (ref <5.0)
Triglycerides: 129 mg/dL (ref <150)

## 2024-03-10 LAB — CBC WITH DIFFERENTIAL/PLATELET
Absolute Lymphocytes: 2562 {cells}/uL (ref 850–3900)
Absolute Monocytes: 621 {cells}/uL (ref 200–950)
Basophils Absolute: 44 {cells}/uL (ref 0–200)
Basophils Relative: 0.6 %
Eosinophils Absolute: 88 {cells}/uL (ref 15–500)
Eosinophils Relative: 1.2 %
HCT: 39.9 % (ref 35.0–45.0)
Hemoglobin: 12.9 g/dL (ref 11.7–15.5)
MCH: 29.3 pg (ref 27.0–33.0)
MCHC: 32.3 g/dL (ref 32.0–36.0)
MCV: 90.5 fL (ref 80.0–100.0)
MPV: 10 fL (ref 7.5–12.5)
Monocytes Relative: 8.5 %
Neutro Abs: 3986 {cells}/uL (ref 1500–7800)
Neutrophils Relative %: 54.6 %
Platelets: 388 10*3/uL (ref 140–400)
RBC: 4.41 10*6/uL (ref 3.80–5.10)
RDW: 13.3 % (ref 11.0–15.0)
Total Lymphocyte: 35.1 %
WBC: 7.3 10*3/uL (ref 3.8–10.8)

## 2024-03-10 LAB — HEMOGLOBIN A1C
Hgb A1c MFr Bld: 5.6 % (ref <5.7)
Mean Plasma Glucose: 114 mg/dL
eAG (mmol/L): 6.3 mmol/L

## 2024-03-10 LAB — TSH: TSH: 1.16 m[IU]/L

## 2024-03-10 LAB — COMPREHENSIVE METABOLIC PANEL WITH GFR
AG Ratio: 1.6 (calc) (ref 1.0–2.5)
ALT: 15 U/L (ref 6–29)
AST: 18 U/L (ref 10–35)
Albumin: 4 g/dL (ref 3.6–5.1)
Alkaline phosphatase (APISO): 63 U/L (ref 37–153)
BUN: 11 mg/dL (ref 7–25)
CO2: 28 mmol/L (ref 20–32)
Calcium: 9.5 mg/dL (ref 8.6–10.4)
Chloride: 104 mmol/L (ref 98–110)
Creat: 0.73 mg/dL (ref 0.50–1.03)
Globulin: 2.5 g/dL (ref 1.9–3.7)
Glucose, Bld: 75 mg/dL (ref 65–99)
Potassium: 4.3 mmol/L (ref 3.5–5.3)
Sodium: 140 mmol/L (ref 135–146)
Total Bilirubin: 0.4 mg/dL (ref 0.2–1.2)
Total Protein: 6.5 g/dL (ref 6.1–8.1)
eGFR: 100 mL/min/{1.73_m2} (ref >=60)

## 2024-03-10 LAB — VITAMIN D 25 HYDROXY (VIT D DEFICIENCY, FRACTURES): Vit D, 25-Hydroxy: 34 ng/mL (ref 30–100)

## 2024-03-13 ENCOUNTER — Encounter: Payer: Self-pay | Admitting: Family Medicine

## 2024-03-13 NOTE — Progress Notes (Signed)
 Cholesterol is still elevated, but making progress! Overall risk is low (calculation below). Continue healthy lifestyle measures and Lipitor.  A1c is coming down! Other labs are good!  The 10-year ASCVD risk score (Arnett DK, et al., 2019) is: 2%   Values used to calculate the score:     Age: 53 years     Sex: Female     Is Non-Hispanic African American: Yes     Diabetic: No     Tobacco smoker: No     Systolic Blood Pressure: 121 mmHg     Is BP treated: No     HDL Cholesterol: 46 mg/dL     Total Cholesterol: 201 mg/dL

## 2024-09-17 NOTE — Assessment & Plan Note (Deleted)
 Tolerating statin, encouraged heart healthy diet, avoid trans fats, minimize simple carbs and saturated fats. Increase exercise as tolerated

## 2024-09-17 NOTE — Assessment & Plan Note (Deleted)
 Supplement and monitor

## 2024-09-17 NOTE — Assessment & Plan Note (Deleted)
 Encouraged DASH or MIND diet, decrease po intake and increase exercise as tolerated. Needs 7-8 hours of sleep nightly. Avoid trans fats, eat small, frequent meals every 4-5 hours with lean proteins, complex carbs and healthy fats. Minimize simple carbs, high fat foods and processed foods

## 2024-09-17 NOTE — Assessment & Plan Note (Deleted)
 hgba1c acceptable, minimize simple carbs. Increase exercise as tolerated.

## 2024-09-18 ENCOUNTER — Ambulatory Visit: Admitting: Family Medicine

## 2024-09-18 DIAGNOSIS — R7303 Prediabetes: Secondary | ICD-10-CM

## 2024-09-18 DIAGNOSIS — E782 Mixed hyperlipidemia: Secondary | ICD-10-CM

## 2024-09-18 DIAGNOSIS — R7989 Other specified abnormal findings of blood chemistry: Secondary | ICD-10-CM

## 2024-12-07 ENCOUNTER — Other Ambulatory Visit (HOSPITAL_COMMUNITY): Payer: Self-pay | Admitting: Family Medicine

## 2024-12-07 DIAGNOSIS — Z1231 Encounter for screening mammogram for malignant neoplasm of breast: Secondary | ICD-10-CM

## 2024-12-14 ENCOUNTER — Ambulatory Visit (HOSPITAL_COMMUNITY)

## 2024-12-25 ENCOUNTER — Ambulatory Visit (HOSPITAL_COMMUNITY)

## 2025-01-01 ENCOUNTER — Ambulatory Visit (HOSPITAL_COMMUNITY)

## 2025-01-15 ENCOUNTER — Ambulatory Visit: Admitting: Family Medicine
